# Patient Record
Sex: Male | Born: 1995 | Race: White | Hispanic: No | Marital: Single | Smoking: Never smoker
Health system: Southern US, Community
[De-identification: ages and names within clinical notes are randomized; demographics above are authoritative.]

## PROBLEM LIST (undated history)

## (undated) DIAGNOSIS — I10 Essential (primary) hypertension: Secondary | ICD-10-CM

---
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
## 2013-04-26 ENCOUNTER — Other Ambulatory Visit: Payer: Self-pay | Admitting: Endocrinology

## 2013-04-26 DIAGNOSIS — E049 Nontoxic goiter, unspecified: Secondary | ICD-10-CM

## 2013-04-28 ENCOUNTER — Ambulatory Visit
Admission: RE | Admit: 2013-04-28 | Discharge: 2013-04-28 | Disposition: A | Discharge status: Home / Self Care | Payer: BC Managed Care – PPO | Source: Ambulatory Visit | Attending: Endocrinology | Admitting: Endocrinology

## 2013-04-28 ENCOUNTER — Other Ambulatory Visit: Payer: BC Managed Care – PPO

## 2013-04-28 DIAGNOSIS — E049 Nontoxic goiter, unspecified: Secondary | ICD-10-CM

## 2014-05-31 IMAGING — US US SOFT TISSUE HEAD/NECK
1 series · 14 of 25 positions shown · non-contrast
Comparison: Ultrasound of the thyroid of 06/25/2012

CLINICAL DATA: Follow up of thyroid nodule

THYROID ULTRASOUND
TECHNIQUE: Ultrasound examination of the thyroid gland and adjacent
soft tissues was performed.

[Series 1: us soft tissue head/neck · 0.08mm/px · 14 of 46 slices shown]
[im 1/46]
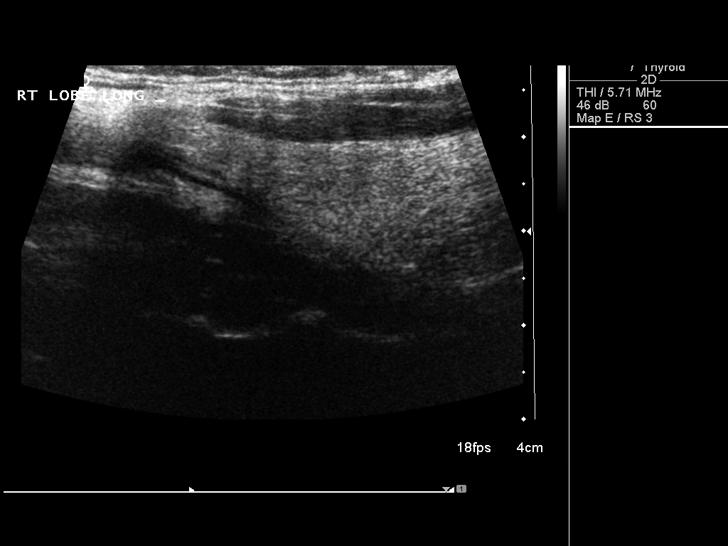
[im 4/46]
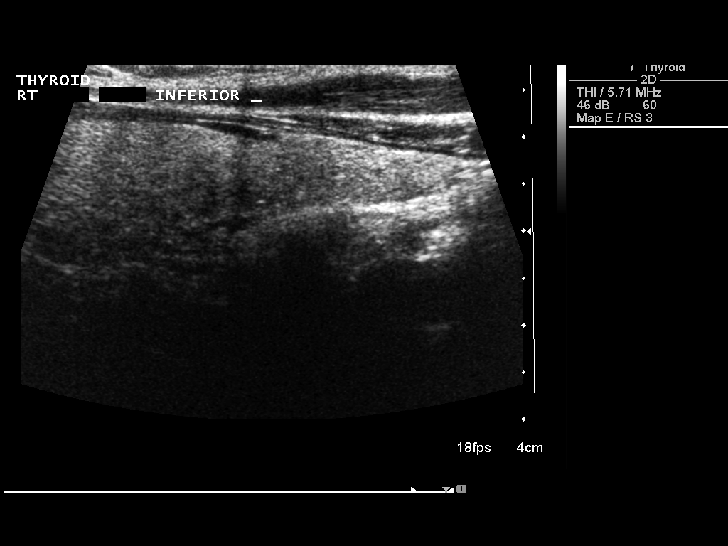
[im 8/46]
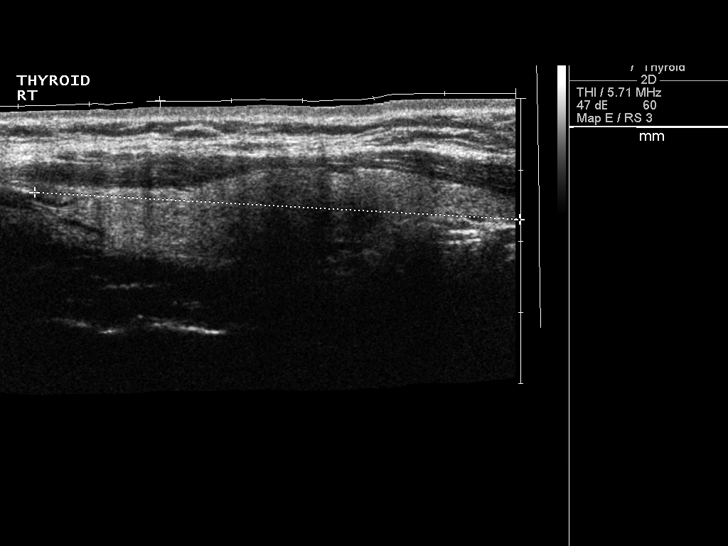
[im 12/46]
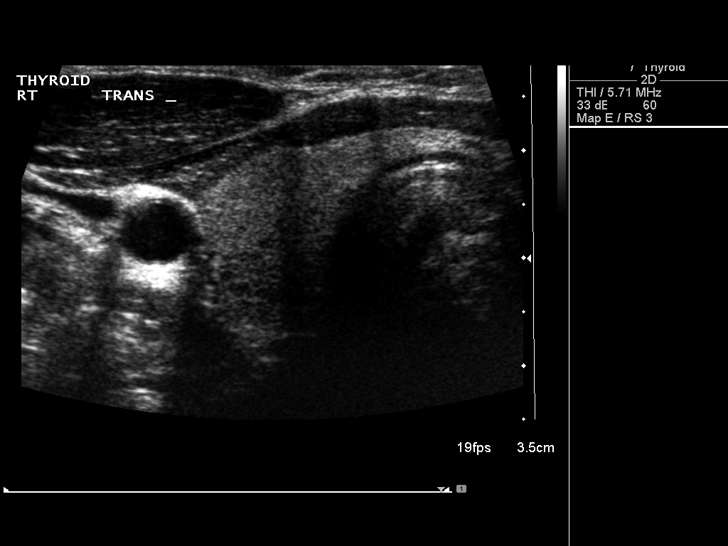
[im 16/46]
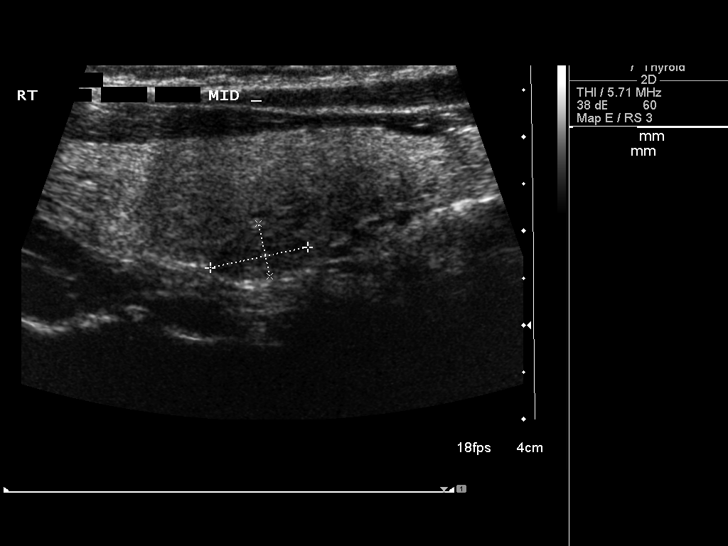
[im 17/46]
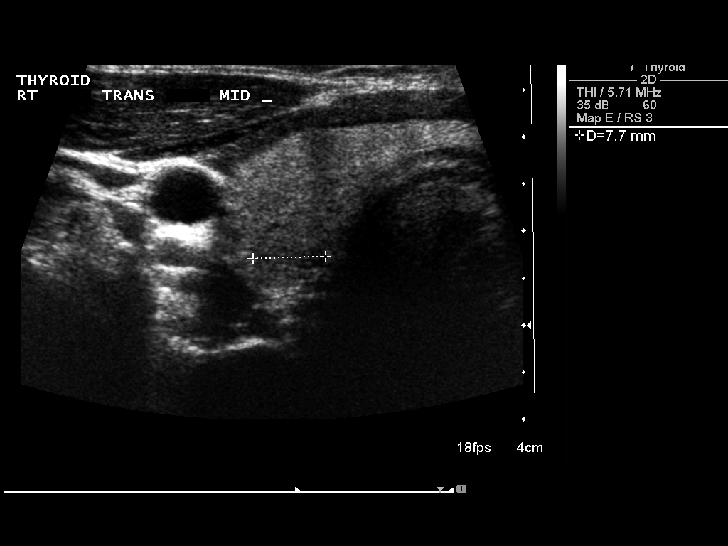
[im 21/46]
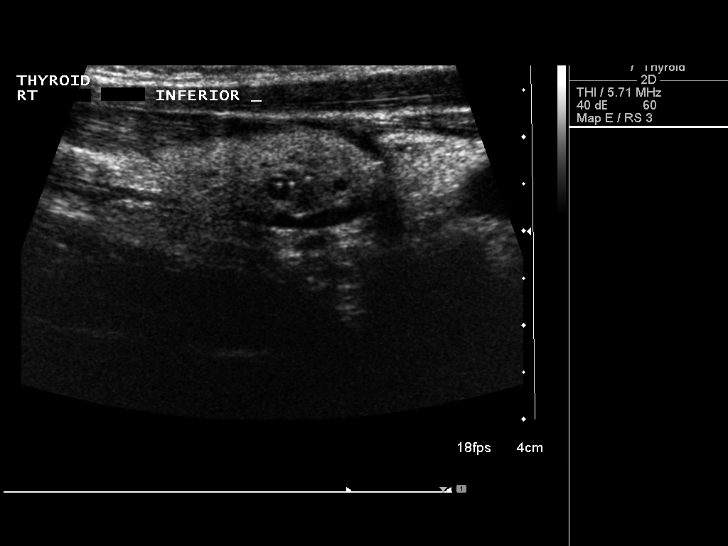
[im 25/46]
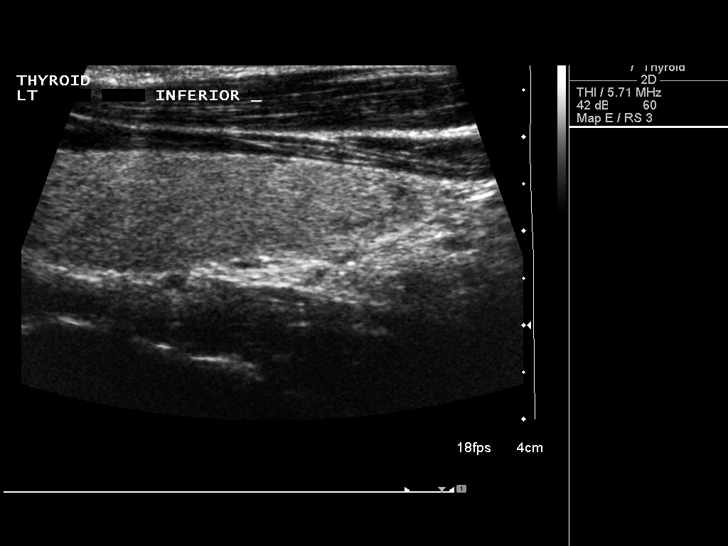
[im 29/46]
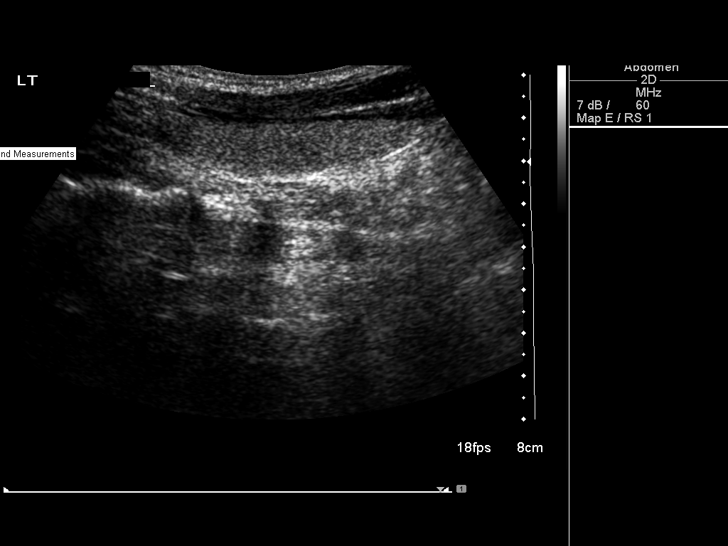
[im 31/46]
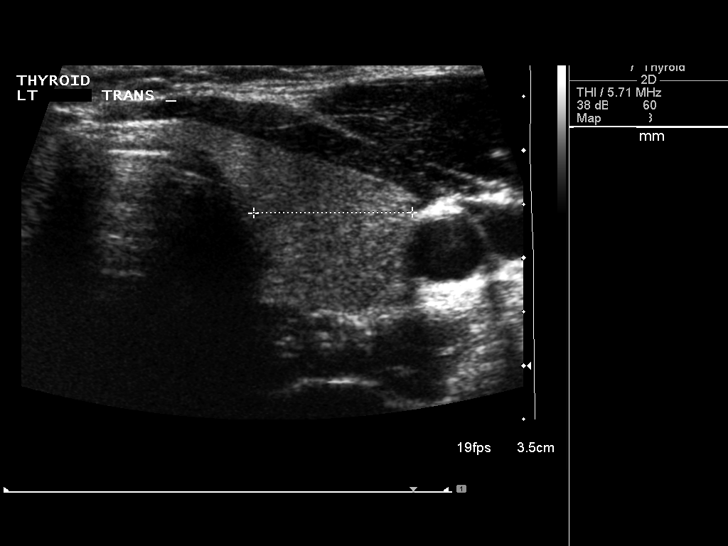
[im 34/46]
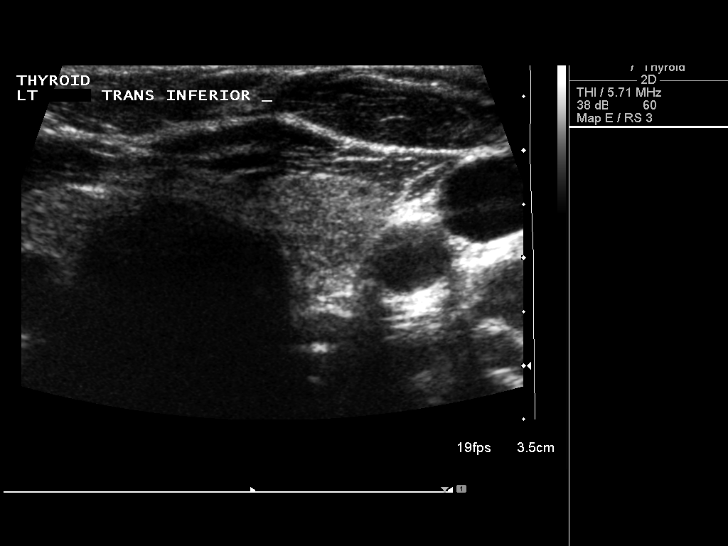
[im 38/46]
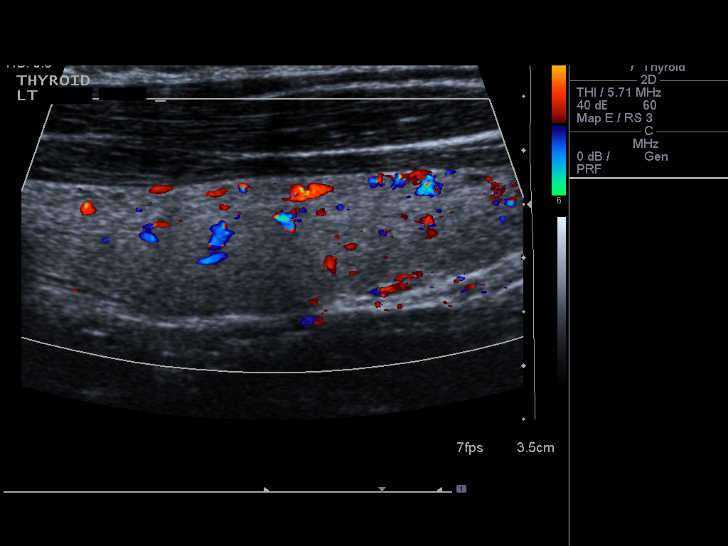
[im 42/46]
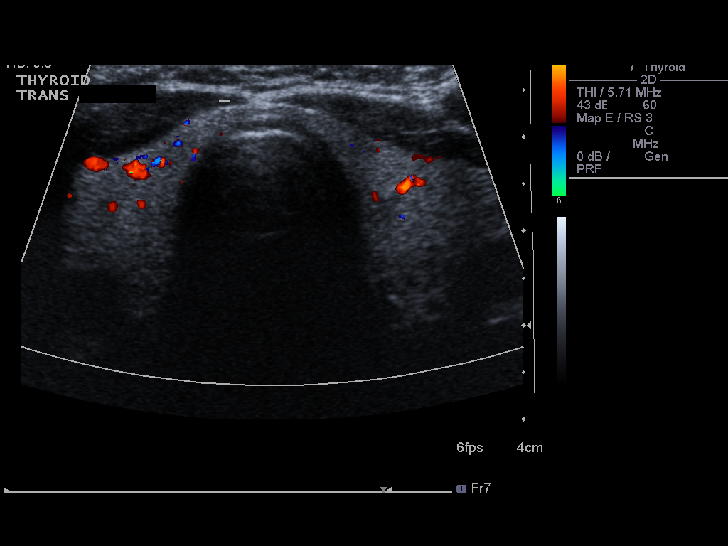
[im 46/46]
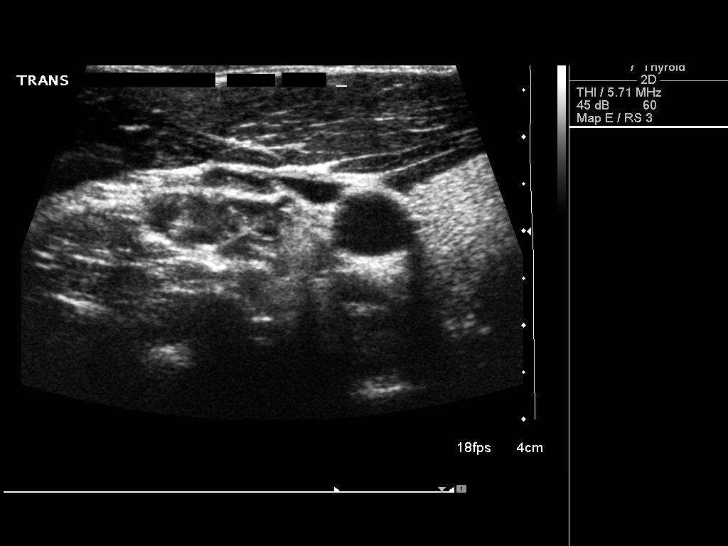

[14 of 25 positions shown; findings below may reference images not displayed]

FINDINGS: Right thyroid lobe:  7.0 x 1.6 x 1.3 cm.  (Previously 7.3 x 1.5 x
1.6 cm).
Left thyroid lobe:  6.5 x 1.3 x 1.5 cm.  (Previously 5.9 x 1.4 x
1.9 cm).
Isthmus:  2 mm in thickness.

Focal nodules:  The echogenicity of the thyroid parenchyma is
homogeneous.  Only a single solid nodule is noted in the right
middle lobe of 1.1 x 0.6 x 0.8 cm, compared to prior measurement of
1.3 x 1.0 x 1.1 cm.  No new or enlarging thyroid nodule is seen.
Smaller hypoechoic nodules are present bilaterally.

Lymphadenopathy:  None visualized.
IMPRESSION: Stable solid nodule in the mid right lobe measuring 1.1 cm in
maximum diameter.  No new or enlarging thyroid nodule is seen.

## 2017-11-30 DIAGNOSIS — J3081 Allergic rhinitis due to animal (cat) (dog) hair and dander: Secondary | ICD-10-CM | POA: Diagnosis not present

## 2017-11-30 DIAGNOSIS — J3089 Other allergic rhinitis: Secondary | ICD-10-CM | POA: Diagnosis not present

## 2017-11-30 DIAGNOSIS — J301 Allergic rhinitis due to pollen: Secondary | ICD-10-CM | POA: Diagnosis not present

## 2017-12-01 DIAGNOSIS — J3089 Other allergic rhinitis: Secondary | ICD-10-CM | POA: Diagnosis not present

## 2017-12-04 DIAGNOSIS — J3081 Allergic rhinitis due to animal (cat) (dog) hair and dander: Secondary | ICD-10-CM | POA: Diagnosis not present

## 2017-12-04 DIAGNOSIS — J301 Allergic rhinitis due to pollen: Secondary | ICD-10-CM | POA: Diagnosis not present

## 2017-12-09 DIAGNOSIS — J3081 Allergic rhinitis due to animal (cat) (dog) hair and dander: Secondary | ICD-10-CM | POA: Diagnosis not present

## 2017-12-09 DIAGNOSIS — J301 Allergic rhinitis due to pollen: Secondary | ICD-10-CM | POA: Diagnosis not present

## 2017-12-09 DIAGNOSIS — J3089 Other allergic rhinitis: Secondary | ICD-10-CM | POA: Diagnosis not present

## 2017-12-14 DIAGNOSIS — J3081 Allergic rhinitis due to animal (cat) (dog) hair and dander: Secondary | ICD-10-CM | POA: Diagnosis not present

## 2017-12-14 DIAGNOSIS — J3089 Other allergic rhinitis: Secondary | ICD-10-CM | POA: Diagnosis not present

## 2017-12-14 DIAGNOSIS — J301 Allergic rhinitis due to pollen: Secondary | ICD-10-CM | POA: Diagnosis not present

## 2017-12-24 DIAGNOSIS — J3089 Other allergic rhinitis: Secondary | ICD-10-CM | POA: Diagnosis not present

## 2017-12-24 DIAGNOSIS — J3081 Allergic rhinitis due to animal (cat) (dog) hair and dander: Secondary | ICD-10-CM | POA: Diagnosis not present

## 2017-12-24 DIAGNOSIS — J301 Allergic rhinitis due to pollen: Secondary | ICD-10-CM | POA: Diagnosis not present

## 2017-12-29 DIAGNOSIS — J3081 Allergic rhinitis due to animal (cat) (dog) hair and dander: Secondary | ICD-10-CM | POA: Diagnosis not present

## 2017-12-29 DIAGNOSIS — J3089 Other allergic rhinitis: Secondary | ICD-10-CM | POA: Diagnosis not present

## 2017-12-29 DIAGNOSIS — J301 Allergic rhinitis due to pollen: Secondary | ICD-10-CM | POA: Diagnosis not present

## 2018-01-06 DIAGNOSIS — J301 Allergic rhinitis due to pollen: Secondary | ICD-10-CM | POA: Diagnosis not present

## 2018-01-06 DIAGNOSIS — J3089 Other allergic rhinitis: Secondary | ICD-10-CM | POA: Diagnosis not present

## 2018-01-06 DIAGNOSIS — J3081 Allergic rhinitis due to animal (cat) (dog) hair and dander: Secondary | ICD-10-CM | POA: Diagnosis not present

## 2018-01-11 DIAGNOSIS — J3089 Other allergic rhinitis: Secondary | ICD-10-CM | POA: Diagnosis not present

## 2018-01-11 DIAGNOSIS — J3081 Allergic rhinitis due to animal (cat) (dog) hair and dander: Secondary | ICD-10-CM | POA: Diagnosis not present

## 2018-01-11 DIAGNOSIS — J301 Allergic rhinitis due to pollen: Secondary | ICD-10-CM | POA: Diagnosis not present

## 2018-01-18 DIAGNOSIS — J3081 Allergic rhinitis due to animal (cat) (dog) hair and dander: Secondary | ICD-10-CM | POA: Diagnosis not present

## 2018-01-18 DIAGNOSIS — J301 Allergic rhinitis due to pollen: Secondary | ICD-10-CM | POA: Diagnosis not present

## 2018-01-26 DIAGNOSIS — J3089 Other allergic rhinitis: Secondary | ICD-10-CM | POA: Diagnosis not present

## 2018-01-26 DIAGNOSIS — J301 Allergic rhinitis due to pollen: Secondary | ICD-10-CM | POA: Diagnosis not present

## 2018-01-26 DIAGNOSIS — J3081 Allergic rhinitis due to animal (cat) (dog) hair and dander: Secondary | ICD-10-CM | POA: Diagnosis not present

## 2018-02-03 DIAGNOSIS — J301 Allergic rhinitis due to pollen: Secondary | ICD-10-CM | POA: Diagnosis not present

## 2018-02-03 DIAGNOSIS — J3089 Other allergic rhinitis: Secondary | ICD-10-CM | POA: Diagnosis not present

## 2018-02-03 DIAGNOSIS — J3081 Allergic rhinitis due to animal (cat) (dog) hair and dander: Secondary | ICD-10-CM | POA: Diagnosis not present

## 2018-02-08 DIAGNOSIS — J301 Allergic rhinitis due to pollen: Secondary | ICD-10-CM | POA: Diagnosis not present

## 2018-02-08 DIAGNOSIS — J3089 Other allergic rhinitis: Secondary | ICD-10-CM | POA: Diagnosis not present

## 2018-02-08 DIAGNOSIS — J3081 Allergic rhinitis due to animal (cat) (dog) hair and dander: Secondary | ICD-10-CM | POA: Diagnosis not present

## 2018-02-17 DIAGNOSIS — J3089 Other allergic rhinitis: Secondary | ICD-10-CM | POA: Diagnosis not present

## 2018-02-17 DIAGNOSIS — J3081 Allergic rhinitis due to animal (cat) (dog) hair and dander: Secondary | ICD-10-CM | POA: Diagnosis not present

## 2018-02-17 DIAGNOSIS — J301 Allergic rhinitis due to pollen: Secondary | ICD-10-CM | POA: Diagnosis not present

## 2018-02-23 DIAGNOSIS — J3089 Other allergic rhinitis: Secondary | ICD-10-CM | POA: Diagnosis not present

## 2018-02-23 DIAGNOSIS — J3081 Allergic rhinitis due to animal (cat) (dog) hair and dander: Secondary | ICD-10-CM | POA: Diagnosis not present

## 2018-02-23 DIAGNOSIS — J301 Allergic rhinitis due to pollen: Secondary | ICD-10-CM | POA: Diagnosis not present

## 2018-02-26 DIAGNOSIS — J3081 Allergic rhinitis due to animal (cat) (dog) hair and dander: Secondary | ICD-10-CM | POA: Diagnosis not present

## 2018-02-26 DIAGNOSIS — J301 Allergic rhinitis due to pollen: Secondary | ICD-10-CM | POA: Diagnosis not present

## 2018-03-03 DIAGNOSIS — J301 Allergic rhinitis due to pollen: Secondary | ICD-10-CM | POA: Diagnosis not present

## 2018-03-03 DIAGNOSIS — J3081 Allergic rhinitis due to animal (cat) (dog) hair and dander: Secondary | ICD-10-CM | POA: Diagnosis not present

## 2018-03-03 DIAGNOSIS — J3089 Other allergic rhinitis: Secondary | ICD-10-CM | POA: Diagnosis not present

## 2018-03-08 DIAGNOSIS — J3089 Other allergic rhinitis: Secondary | ICD-10-CM | POA: Diagnosis not present

## 2018-03-08 DIAGNOSIS — J3081 Allergic rhinitis due to animal (cat) (dog) hair and dander: Secondary | ICD-10-CM | POA: Diagnosis not present

## 2018-03-08 DIAGNOSIS — J301 Allergic rhinitis due to pollen: Secondary | ICD-10-CM | POA: Diagnosis not present

## 2018-03-12 DIAGNOSIS — J301 Allergic rhinitis due to pollen: Secondary | ICD-10-CM | POA: Diagnosis not present

## 2018-03-12 DIAGNOSIS — J3081 Allergic rhinitis due to animal (cat) (dog) hair and dander: Secondary | ICD-10-CM | POA: Diagnosis not present

## 2018-03-18 DIAGNOSIS — J301 Allergic rhinitis due to pollen: Secondary | ICD-10-CM | POA: Diagnosis not present

## 2018-03-18 DIAGNOSIS — J3081 Allergic rhinitis due to animal (cat) (dog) hair and dander: Secondary | ICD-10-CM | POA: Diagnosis not present

## 2018-03-18 DIAGNOSIS — J3089 Other allergic rhinitis: Secondary | ICD-10-CM | POA: Diagnosis not present

## 2018-03-22 DIAGNOSIS — J3081 Allergic rhinitis due to animal (cat) (dog) hair and dander: Secondary | ICD-10-CM | POA: Diagnosis not present

## 2018-03-22 DIAGNOSIS — J3089 Other allergic rhinitis: Secondary | ICD-10-CM | POA: Diagnosis not present

## 2018-03-22 DIAGNOSIS — J301 Allergic rhinitis due to pollen: Secondary | ICD-10-CM | POA: Diagnosis not present

## 2018-03-31 DIAGNOSIS — J301 Allergic rhinitis due to pollen: Secondary | ICD-10-CM | POA: Diagnosis not present

## 2018-03-31 DIAGNOSIS — J3089 Other allergic rhinitis: Secondary | ICD-10-CM | POA: Diagnosis not present

## 2018-03-31 DIAGNOSIS — J3081 Allergic rhinitis due to animal (cat) (dog) hair and dander: Secondary | ICD-10-CM | POA: Diagnosis not present

## 2018-04-14 DIAGNOSIS — J301 Allergic rhinitis due to pollen: Secondary | ICD-10-CM | POA: Diagnosis not present

## 2018-04-14 DIAGNOSIS — J3081 Allergic rhinitis due to animal (cat) (dog) hair and dander: Secondary | ICD-10-CM | POA: Diagnosis not present

## 2018-04-14 DIAGNOSIS — J3089 Other allergic rhinitis: Secondary | ICD-10-CM | POA: Diagnosis not present

## 2018-04-29 DIAGNOSIS — J3089 Other allergic rhinitis: Secondary | ICD-10-CM | POA: Diagnosis not present

## 2018-04-29 DIAGNOSIS — J301 Allergic rhinitis due to pollen: Secondary | ICD-10-CM | POA: Diagnosis not present

## 2018-04-29 DIAGNOSIS — J3081 Allergic rhinitis due to animal (cat) (dog) hair and dander: Secondary | ICD-10-CM | POA: Diagnosis not present

## 2018-05-12 DIAGNOSIS — J3081 Allergic rhinitis due to animal (cat) (dog) hair and dander: Secondary | ICD-10-CM | POA: Diagnosis not present

## 2018-05-12 DIAGNOSIS — J301 Allergic rhinitis due to pollen: Secondary | ICD-10-CM | POA: Diagnosis not present

## 2018-05-12 DIAGNOSIS — J3089 Other allergic rhinitis: Secondary | ICD-10-CM | POA: Diagnosis not present

## 2018-05-19 DIAGNOSIS — J301 Allergic rhinitis due to pollen: Secondary | ICD-10-CM | POA: Diagnosis not present

## 2018-05-19 DIAGNOSIS — J3089 Other allergic rhinitis: Secondary | ICD-10-CM | POA: Diagnosis not present

## 2018-05-19 DIAGNOSIS — J3081 Allergic rhinitis due to animal (cat) (dog) hair and dander: Secondary | ICD-10-CM | POA: Diagnosis not present

## 2018-06-04 DIAGNOSIS — J301 Allergic rhinitis due to pollen: Secondary | ICD-10-CM | POA: Diagnosis not present

## 2018-06-04 DIAGNOSIS — J3081 Allergic rhinitis due to animal (cat) (dog) hair and dander: Secondary | ICD-10-CM | POA: Diagnosis not present

## 2018-06-04 DIAGNOSIS — J3089 Other allergic rhinitis: Secondary | ICD-10-CM | POA: Diagnosis not present

## 2018-06-09 DIAGNOSIS — J3089 Other allergic rhinitis: Secondary | ICD-10-CM | POA: Diagnosis not present

## 2018-06-09 DIAGNOSIS — J3081 Allergic rhinitis due to animal (cat) (dog) hair and dander: Secondary | ICD-10-CM | POA: Diagnosis not present

## 2018-06-09 DIAGNOSIS — J301 Allergic rhinitis due to pollen: Secondary | ICD-10-CM | POA: Diagnosis not present

## 2018-06-11 DIAGNOSIS — J3089 Other allergic rhinitis: Secondary | ICD-10-CM | POA: Diagnosis not present

## 2018-06-30 DIAGNOSIS — J3081 Allergic rhinitis due to animal (cat) (dog) hair and dander: Secondary | ICD-10-CM | POA: Diagnosis not present

## 2018-06-30 DIAGNOSIS — J3089 Other allergic rhinitis: Secondary | ICD-10-CM | POA: Diagnosis not present

## 2018-06-30 DIAGNOSIS — J301 Allergic rhinitis due to pollen: Secondary | ICD-10-CM | POA: Diagnosis not present

## 2018-07-14 DIAGNOSIS — J3081 Allergic rhinitis due to animal (cat) (dog) hair and dander: Secondary | ICD-10-CM | POA: Diagnosis not present

## 2018-07-14 DIAGNOSIS — J3089 Other allergic rhinitis: Secondary | ICD-10-CM | POA: Diagnosis not present

## 2018-07-14 DIAGNOSIS — J301 Allergic rhinitis due to pollen: Secondary | ICD-10-CM | POA: Diagnosis not present

## 2018-07-22 DIAGNOSIS — J301 Allergic rhinitis due to pollen: Secondary | ICD-10-CM | POA: Diagnosis not present

## 2018-07-22 DIAGNOSIS — J3081 Allergic rhinitis due to animal (cat) (dog) hair and dander: Secondary | ICD-10-CM | POA: Diagnosis not present

## 2018-07-22 DIAGNOSIS — J3089 Other allergic rhinitis: Secondary | ICD-10-CM | POA: Diagnosis not present

## 2018-08-09 DIAGNOSIS — J3081 Allergic rhinitis due to animal (cat) (dog) hair and dander: Secondary | ICD-10-CM | POA: Diagnosis not present

## 2018-08-09 DIAGNOSIS — J3089 Other allergic rhinitis: Secondary | ICD-10-CM | POA: Diagnosis not present

## 2018-08-09 DIAGNOSIS — J301 Allergic rhinitis due to pollen: Secondary | ICD-10-CM | POA: Diagnosis not present

## 2018-08-13 DIAGNOSIS — J3089 Other allergic rhinitis: Secondary | ICD-10-CM | POA: Diagnosis not present

## 2018-08-19 DIAGNOSIS — J3081 Allergic rhinitis due to animal (cat) (dog) hair and dander: Secondary | ICD-10-CM | POA: Diagnosis not present

## 2018-08-19 DIAGNOSIS — J301 Allergic rhinitis due to pollen: Secondary | ICD-10-CM | POA: Diagnosis not present

## 2018-08-19 DIAGNOSIS — J3089 Other allergic rhinitis: Secondary | ICD-10-CM | POA: Diagnosis not present

## 2018-09-01 DIAGNOSIS — J3089 Other allergic rhinitis: Secondary | ICD-10-CM | POA: Diagnosis not present

## 2018-09-01 DIAGNOSIS — J3081 Allergic rhinitis due to animal (cat) (dog) hair and dander: Secondary | ICD-10-CM | POA: Diagnosis not present

## 2018-09-01 DIAGNOSIS — J301 Allergic rhinitis due to pollen: Secondary | ICD-10-CM | POA: Diagnosis not present

## 2018-09-10 DIAGNOSIS — J3081 Allergic rhinitis due to animal (cat) (dog) hair and dander: Secondary | ICD-10-CM | POA: Diagnosis not present

## 2018-09-10 DIAGNOSIS — J301 Allergic rhinitis due to pollen: Secondary | ICD-10-CM | POA: Diagnosis not present

## 2018-09-10 DIAGNOSIS — J3089 Other allergic rhinitis: Secondary | ICD-10-CM | POA: Diagnosis not present

## 2018-09-22 DIAGNOSIS — J3089 Other allergic rhinitis: Secondary | ICD-10-CM | POA: Diagnosis not present

## 2018-09-22 DIAGNOSIS — J3081 Allergic rhinitis due to animal (cat) (dog) hair and dander: Secondary | ICD-10-CM | POA: Diagnosis not present

## 2018-09-22 DIAGNOSIS — J301 Allergic rhinitis due to pollen: Secondary | ICD-10-CM | POA: Diagnosis not present

## 2018-10-08 DIAGNOSIS — J3081 Allergic rhinitis due to animal (cat) (dog) hair and dander: Secondary | ICD-10-CM | POA: Diagnosis not present

## 2018-10-08 DIAGNOSIS — J3089 Other allergic rhinitis: Secondary | ICD-10-CM | POA: Diagnosis not present

## 2018-10-08 DIAGNOSIS — J301 Allergic rhinitis due to pollen: Secondary | ICD-10-CM | POA: Diagnosis not present

## 2018-10-12 DIAGNOSIS — J3081 Allergic rhinitis due to animal (cat) (dog) hair and dander: Secondary | ICD-10-CM | POA: Diagnosis not present

## 2018-10-12 DIAGNOSIS — J301 Allergic rhinitis due to pollen: Secondary | ICD-10-CM | POA: Diagnosis not present

## 2018-10-19 DIAGNOSIS — J3089 Other allergic rhinitis: Secondary | ICD-10-CM | POA: Diagnosis not present

## 2018-10-19 DIAGNOSIS — J301 Allergic rhinitis due to pollen: Secondary | ICD-10-CM | POA: Diagnosis not present

## 2018-10-19 DIAGNOSIS — J3081 Allergic rhinitis due to animal (cat) (dog) hair and dander: Secondary | ICD-10-CM | POA: Diagnosis not present

## 2018-11-02 DIAGNOSIS — J3081 Allergic rhinitis due to animal (cat) (dog) hair and dander: Secondary | ICD-10-CM | POA: Diagnosis not present

## 2018-11-02 DIAGNOSIS — J4530 Mild persistent asthma, uncomplicated: Secondary | ICD-10-CM | POA: Diagnosis not present

## 2018-11-02 DIAGNOSIS — J3089 Other allergic rhinitis: Secondary | ICD-10-CM | POA: Diagnosis not present

## 2018-11-02 DIAGNOSIS — J301 Allergic rhinitis due to pollen: Secondary | ICD-10-CM | POA: Diagnosis not present

## 2018-11-16 DIAGNOSIS — J301 Allergic rhinitis due to pollen: Secondary | ICD-10-CM | POA: Diagnosis not present

## 2018-11-16 DIAGNOSIS — J3089 Other allergic rhinitis: Secondary | ICD-10-CM | POA: Diagnosis not present

## 2018-11-16 DIAGNOSIS — J3081 Allergic rhinitis due to animal (cat) (dog) hair and dander: Secondary | ICD-10-CM | POA: Diagnosis not present

## 2018-11-26 DIAGNOSIS — J301 Allergic rhinitis due to pollen: Secondary | ICD-10-CM | POA: Diagnosis not present

## 2018-11-26 DIAGNOSIS — J3081 Allergic rhinitis due to animal (cat) (dog) hair and dander: Secondary | ICD-10-CM | POA: Diagnosis not present

## 2018-11-26 DIAGNOSIS — J3089 Other allergic rhinitis: Secondary | ICD-10-CM | POA: Diagnosis not present

## 2018-12-09 DIAGNOSIS — J301 Allergic rhinitis due to pollen: Secondary | ICD-10-CM | POA: Diagnosis not present

## 2018-12-09 DIAGNOSIS — J3081 Allergic rhinitis due to animal (cat) (dog) hair and dander: Secondary | ICD-10-CM | POA: Diagnosis not present

## 2018-12-09 DIAGNOSIS — J3089 Other allergic rhinitis: Secondary | ICD-10-CM | POA: Diagnosis not present

## 2018-12-13 DIAGNOSIS — J3081 Allergic rhinitis due to animal (cat) (dog) hair and dander: Secondary | ICD-10-CM | POA: Diagnosis not present

## 2018-12-13 DIAGNOSIS — J301 Allergic rhinitis due to pollen: Secondary | ICD-10-CM | POA: Diagnosis not present

## 2018-12-13 DIAGNOSIS — J3089 Other allergic rhinitis: Secondary | ICD-10-CM | POA: Diagnosis not present

## 2018-12-20 DIAGNOSIS — J301 Allergic rhinitis due to pollen: Secondary | ICD-10-CM | POA: Diagnosis not present

## 2018-12-20 DIAGNOSIS — J3089 Other allergic rhinitis: Secondary | ICD-10-CM | POA: Diagnosis not present

## 2018-12-20 DIAGNOSIS — J3081 Allergic rhinitis due to animal (cat) (dog) hair and dander: Secondary | ICD-10-CM | POA: Diagnosis not present

## 2018-12-27 DIAGNOSIS — J3089 Other allergic rhinitis: Secondary | ICD-10-CM | POA: Diagnosis not present

## 2018-12-27 DIAGNOSIS — J301 Allergic rhinitis due to pollen: Secondary | ICD-10-CM | POA: Diagnosis not present

## 2018-12-27 DIAGNOSIS — J3081 Allergic rhinitis due to animal (cat) (dog) hair and dander: Secondary | ICD-10-CM | POA: Diagnosis not present

## 2018-12-31 DIAGNOSIS — J3081 Allergic rhinitis due to animal (cat) (dog) hair and dander: Secondary | ICD-10-CM | POA: Diagnosis not present

## 2018-12-31 DIAGNOSIS — J301 Allergic rhinitis due to pollen: Secondary | ICD-10-CM | POA: Diagnosis not present

## 2018-12-31 DIAGNOSIS — J3089 Other allergic rhinitis: Secondary | ICD-10-CM | POA: Diagnosis not present

## 2019-01-07 DIAGNOSIS — Z8342 Family history of familial hypercholesterolemia: Secondary | ICD-10-CM | POA: Diagnosis not present

## 2019-01-07 DIAGNOSIS — R110 Nausea: Secondary | ICD-10-CM | POA: Diagnosis not present

## 2019-01-07 DIAGNOSIS — J302 Other seasonal allergic rhinitis: Secondary | ICD-10-CM | POA: Diagnosis not present

## 2019-01-07 DIAGNOSIS — Z833 Family history of diabetes mellitus: Secondary | ICD-10-CM | POA: Diagnosis not present

## 2019-01-07 DIAGNOSIS — Z1159 Encounter for screening for other viral diseases: Secondary | ICD-10-CM | POA: Diagnosis not present

## 2019-01-07 DIAGNOSIS — J45909 Unspecified asthma, uncomplicated: Secondary | ICD-10-CM | POA: Diagnosis not present

## 2019-01-17 DIAGNOSIS — J3089 Other allergic rhinitis: Secondary | ICD-10-CM | POA: Diagnosis not present

## 2019-01-17 DIAGNOSIS — J3081 Allergic rhinitis due to animal (cat) (dog) hair and dander: Secondary | ICD-10-CM | POA: Diagnosis not present

## 2019-01-17 DIAGNOSIS — J301 Allergic rhinitis due to pollen: Secondary | ICD-10-CM | POA: Diagnosis not present

## 2019-02-16 DIAGNOSIS — J3081 Allergic rhinitis due to animal (cat) (dog) hair and dander: Secondary | ICD-10-CM | POA: Diagnosis not present

## 2019-02-16 DIAGNOSIS — J3089 Other allergic rhinitis: Secondary | ICD-10-CM | POA: Diagnosis not present

## 2019-02-16 DIAGNOSIS — J301 Allergic rhinitis due to pollen: Secondary | ICD-10-CM | POA: Diagnosis not present

## 2019-03-17 DIAGNOSIS — J301 Allergic rhinitis due to pollen: Secondary | ICD-10-CM | POA: Diagnosis not present

## 2019-03-17 DIAGNOSIS — J3089 Other allergic rhinitis: Secondary | ICD-10-CM | POA: Diagnosis not present

## 2019-03-17 DIAGNOSIS — J3081 Allergic rhinitis due to animal (cat) (dog) hair and dander: Secondary | ICD-10-CM | POA: Diagnosis not present

## 2022-10-31 ENCOUNTER — Ambulatory Visit
Admission: RE | Admit: 2022-10-31 | Discharge: 2022-10-31 | Disposition: A | Discharge status: Home / Self Care | Payer: 59 | Source: Ambulatory Visit | Attending: Allergy | Admitting: Allergy

## 2022-10-31 ENCOUNTER — Other Ambulatory Visit: Payer: Self-pay | Admitting: Allergy

## 2022-10-31 DIAGNOSIS — J4530 Mild persistent asthma, uncomplicated: Secondary | ICD-10-CM

## 2023-02-28 ENCOUNTER — Encounter (HOSPITAL_BASED_OUTPATIENT_CLINIC_OR_DEPARTMENT_OTHER): Payer: Self-pay | Admitting: Emergency Medicine

## 2023-02-28 ENCOUNTER — Emergency Department (HOSPITAL_BASED_OUTPATIENT_CLINIC_OR_DEPARTMENT_OTHER)
Admission: EM | Admit: 2023-02-28 | Discharge: 2023-02-28 | Disposition: A | Discharge status: Home / Self Care | Payer: 59 | Attending: Emergency Medicine | Admitting: Emergency Medicine

## 2023-02-28 ENCOUNTER — Other Ambulatory Visit: Payer: Self-pay

## 2023-02-28 DIAGNOSIS — Z0283 Encounter for blood-alcohol and blood-drug test: Secondary | ICD-10-CM | POA: Diagnosis present

## 2023-02-28 HISTORY — DX: Essential (primary) hypertension: I10

## 2023-02-28 NOTE — ED Provider Notes (Signed)
  [facility] Provider Note   CSN: [id number] Arrival date & time: 02/28/23  2105     History  Chief Complaint  Patient presents with   Drug Screen    Robert Mahoney is a 27 y.o. male.  Patient here for drug testing and for his employer.  No symptoms.  The history is provided by the patient.       Home Medications Prior to Admission medications   Not on File      Allergies    Patient has no known allergies.    Review of Systems   Review of Systems  Physical Exam Updated Vital Signs BP (!) 144/78   Pulse 84   Temp 98.4 F (36.9 C) (Oral)   Resp 16   Ht 6\' 1"  (1.854 m)   Wt 88.5 kg   SpO2 99%   BMI 25.73 kg/m  Physical Exam Neurological:     Mental Status: He is alert.     ED Results / Procedures / Treatments   Labs (all labs ordered are listed, but only abnormal results are displayed) Labs Reviewed - No data to display  EKG None  Radiology No results found.  Procedures Procedures    Medications Ordered in ED Medications - No data to display  ED Course/ Medical Decision Making/ A&P                             Medical Decision Making  [name] patient is here for drug screen and for his employer.  Discharged after samples collected per protocol.  Normal vitals.  Well-appearing.  This chart was dictated using voice recognition software.  Despite best efforts to proofread,  errors can occur which can change the documentation meaning.         Final Clinical Impression(s) / ED Diagnoses Final diagnoses:  Encounter for drug screening    Rx / DC Orders ED Discharge Orders     None         Virgina Norfolk, DO 02/28/23 2118

## 2023-02-28 NOTE — ED Triage Notes (Signed)
Pt here for drug screen for his employer

## 2023-05-28 ENCOUNTER — Emergency Department (HOSPITAL_COMMUNITY): Payer: 59

## 2023-05-28 ENCOUNTER — Encounter (HOSPITAL_COMMUNITY): Payer: Self-pay

## 2023-05-28 ENCOUNTER — Observation Stay (HOSPITAL_COMMUNITY): Payer: 59

## 2023-05-28 ENCOUNTER — Observation Stay (HOSPITAL_COMMUNITY)
Admission: EM | Admit: 2023-05-28 | Discharge: 2023-05-29 | Disposition: A | Discharge status: Home / Self Care | Payer: 59 | Attending: Internal Medicine | Admitting: Internal Medicine

## 2023-05-28 ENCOUNTER — Other Ambulatory Visit: Payer: Self-pay

## 2023-05-28 DIAGNOSIS — G929 Unspecified toxic encephalopathy: Secondary | ICD-10-CM | POA: Diagnosis present

## 2023-05-28 DIAGNOSIS — U071 COVID-19: Secondary | ICD-10-CM | POA: Diagnosis present

## 2023-05-28 DIAGNOSIS — Z79899 Other long term (current) drug therapy: Secondary | ICD-10-CM | POA: Diagnosis not present

## 2023-05-28 DIAGNOSIS — R42 Dizziness and giddiness: Secondary | ICD-10-CM | POA: Diagnosis present

## 2023-05-28 DIAGNOSIS — R2681 Unsteadiness on feet: Secondary | ICD-10-CM

## 2023-05-28 DIAGNOSIS — I10 Essential (primary) hypertension: Secondary | ICD-10-CM | POA: Diagnosis not present

## 2023-05-28 DIAGNOSIS — G43109 Migraine with aura, not intractable, without status migrainosus: Secondary | ICD-10-CM

## 2023-05-28 LAB — COMPREHENSIVE METABOLIC PANEL
ALT: 39 U/L (ref 0–44)
AST: 24 U/L (ref 15–41)
Albumin: 4.4 g/dL (ref 3.5–5.0)
Alkaline Phosphatase: 65 U/L (ref 38–126)
Anion gap: 10 (ref 5–15)
BUN: 11 mg/dL (ref 6–20)
CO2: 25 mmol/L (ref 22–32)
Calcium: 9.2 mg/dL (ref 8.9–10.3)
Chloride: 101 mmol/L (ref 98–111)
Creatinine, Ser: 1.09 mg/dL (ref 0.61–1.24)
GFR, Estimated: >60 mL/min (ref >60)
Glucose, Bld: 116 mg/dL — ABNORMAL HIGH (ref 70–99)
Potassium: 3.4 mmol/L — ABNORMAL LOW (ref 3.5–5.1)
Sodium: 136 mmol/L (ref 135–145)
Total Bilirubin: 0.7 mg/dL (ref 0.3–1.2)
Total Protein: 7.8 g/dL (ref 6.5–8.1)

## 2023-05-28 LAB — CBC
HCT: 45 % (ref 39.0–52.0)
Hemoglobin: 15.4 g/dL (ref 13.0–17.0)
MCH: 27.7 pg (ref 26.0–34.0)
MCHC: 34.2 g/dL (ref 30.0–36.0)
MCV: 80.9 fL (ref 80.0–100.0)
Platelets: 258 10*3/uL (ref 150–400)
RBC: 5.56 MIL/uL (ref 4.22–5.81)
RDW: 12 % (ref 11.5–15.5)
WBC: 11.6 10*3/uL — ABNORMAL HIGH (ref 4.0–10.5)
nRBC: 0 % (ref 0.0–0.2)

## 2023-05-28 LAB — I-STAT CHEM 8, ED
BUN: 13 mg/dL (ref 6–20)
Calcium, Ion: 1.12 mmol/L — ABNORMAL LOW (ref 1.15–1.40)
Chloride: 102 mmol/L (ref 98–111)
Creatinine, Ser: 1.1 mg/dL (ref 0.61–1.24)
Glucose, Bld: 114 mg/dL — ABNORMAL HIGH (ref 70–99)
HCT: 46 % (ref 39.0–52.0)
Hemoglobin: 15.6 g/dL (ref 13.0–17.0)
Potassium: 3.5 mmol/L (ref 3.5–5.1)
Sodium: 140 mmol/L (ref 135–145)
TCO2: 26 mmol/L (ref 22–32)

## 2023-05-28 LAB — C-REACTIVE PROTEIN: CRP: 0.9 mg/dL (ref <1.0)

## 2023-05-28 LAB — RAPID URINE DRUG SCREEN, HOSP PERFORMED
Amphetamines: NOT DETECTED
Barbiturates: NOT DETECTED
Benzodiazepines: NOT DETECTED
Cocaine: NOT DETECTED
Opiates: NOT DETECTED
Tetrahydrocannabinol: NOT DETECTED

## 2023-05-28 LAB — PROTIME-INR
INR: 1 (ref 0.8–1.2)
Prothrombin Time: 13.4 seconds (ref 11.4–15.2)

## 2023-05-28 LAB — DIFFERENTIAL
Abs Immature Granulocytes: 0.04 10*3/uL (ref 0.00–0.07)
Basophils Absolute: 0.1 10*3/uL (ref 0.0–0.1)
Basophils Relative: 1 %
Eosinophils Absolute: 0.4 10*3/uL (ref 0.0–0.5)
Eosinophils Relative: 4 %
Immature Granulocytes: 0 %
Lymphocytes Relative: 25 %
Lymphs Abs: 2.9 10*3/uL (ref 0.7–4.0)
Monocytes Absolute: 1.3 10*3/uL — ABNORMAL HIGH (ref 0.1–1.0)
Monocytes Relative: 11 %
Neutro Abs: 6.9 10*3/uL (ref 1.7–7.7)
Neutrophils Relative %: 59 %

## 2023-05-28 LAB — APTT: aPTT: 27 seconds (ref 24–36)

## 2023-05-28 LAB — HIV ANTIBODY (ROUTINE TESTING W REFLEX): HIV Screen 4th Generation wRfx: NONREACTIVE

## 2023-05-28 LAB — VITAMIN B12: Vitamin B-12: 357 pg/mL (ref 180–914)

## 2023-05-28 LAB — ETHANOL: Alcohol, Ethyl (B): <10 mg/dL (ref <10)

## 2023-05-28 LAB — AMMONIA: Ammonia: 39 umol/L — ABNORMAL HIGH (ref 9–35)

## 2023-05-28 MED ORDER — SODIUM CHLORIDE 0.9% FLUSH: 3.0000 mL | Freq: Once | INTRAVENOUS | Status: DC

## 2023-05-28 MED ORDER — ONDANSETRON HCL 4 MG/2ML IJ SOLN: 4.0000 mg | Freq: Four times a day (QID) | INTRAMUSCULAR | Status: DC | PRN

## 2023-05-28 MED ORDER — SENNOSIDES-DOCUSATE SODIUM 8.6-50 MG PO TABS: 1.0000 | ORAL_TABLET | Freq: Every evening | ORAL | Status: DC | PRN

## 2023-05-28 MED ORDER — ACETAMINOPHEN 325 MG PO TABS: 650.0000 mg | ORAL_TABLET | Freq: Four times a day (QID) | ORAL | Status: DC | PRN

## 2023-05-28 MED ORDER — ACETAMINOPHEN 650 MG RE SUPP: 650.0000 mg | Freq: Four times a day (QID) | RECTAL | Status: DC | PRN

## 2023-05-28 MED ORDER — ONDANSETRON HCL 4 MG PO TABS: 4.0000 mg | ORAL_TABLET | Freq: Four times a day (QID) | ORAL | Status: DC | PRN

## 2023-05-28 MED ORDER — FLUTICASONE PROPIONATE 50 MCG/ACT NA SUSP
1.0000 | Freq: Every day | NASAL | Status: DC
  Administered 2023-05-29: 1 via NASAL
  Filled 2023-05-28: qty 16

## 2023-05-28 MED ORDER — GUAIFENESIN ER 600 MG PO TB12
1200.0000 mg | ORAL_TABLET | Freq: Two times a day (BID) | ORAL | Status: DC
  Administered 2023-05-28 – 2023-05-29 (×2): 1200 mg via ORAL
  Filled 2023-05-28 (×2): qty 2

## 2023-05-28 MED ORDER — LACTATED RINGERS IV SOLN: INTRAVENOUS | Status: DC

## 2023-05-28 MED ORDER — BENZONATATE 100 MG PO CAPS
100.0000 mg | ORAL_CAPSULE | Freq: Three times a day (TID) | ORAL | Status: DC
  Administered 2023-05-28 – 2023-05-29 (×2): 100 mg via ORAL
  Filled 2023-05-28 (×3): qty 1

## 2023-05-28 MED ORDER — AMLODIPINE BESYLATE 5 MG PO TABS
5.0000 mg | ORAL_TABLET | Freq: Every day | ORAL | Status: DC
  Administered 2023-05-29: 5 mg via ORAL
  Filled 2023-05-28: qty 1

## 2023-05-28 MED ORDER — IOHEXOL 350 MG/ML SOLN
75.0000 mL | Freq: Once | INTRAVENOUS | Status: AC | PRN
  Administered 2023-05-28: 75 mL via INTRAVENOUS

## 2023-05-28 MED ORDER — DEXAMETHASONE 4 MG PO TABS
6.0000 mg | ORAL_TABLET | Freq: Every day | ORAL | Status: DC
  Administered 2023-05-28 – 2023-05-29 (×2): 6 mg via ORAL
  Filled 2023-05-28 (×2): qty 2

## 2023-05-28 MED ORDER — MOLNUPIRAVIR 200 MG PO CAPS
4.0000 | ORAL_CAPSULE | Freq: Two times a day (BID) | ORAL | Status: DC
  Administered 2023-05-28 – 2023-05-29 (×2): 800 mg via ORAL
  Filled 2023-05-28 (×2): qty 4

## 2023-05-28 MED ORDER — POTASSIUM CHLORIDE CRYS ER 20 MEQ PO TBCR: 40.0000 meq | EXTENDED_RELEASE_TABLET | Freq: Once | ORAL | Status: DC

## 2023-05-28 MED ORDER — ACETAMINOPHEN 500 MG PO TABS: 1000.0000 mg | ORAL_TABLET | Freq: Once | ORAL | Status: DC

## 2023-05-28 MED ORDER — DIPHENHYDRAMINE HCL 50 MG/ML IJ SOLN
25.0000 mg | Freq: Once | INTRAMUSCULAR | Status: AC
  Administered 2023-05-28: 25 mg via INTRAVENOUS
  Filled 2023-05-28: qty 1

## 2023-05-28 MED ORDER — LORAZEPAM 2 MG/ML IJ SOLN
1.0000 mg | INTRAMUSCULAR | Status: DC | PRN
  Administered 2023-05-28: 1 mg via INTRAVENOUS
  Filled 2023-05-28: qty 1

## 2023-05-28 MED ORDER — METOCLOPRAMIDE HCL 5 MG/ML IJ SOLN
10.0000 mg | Freq: Once | INTRAMUSCULAR | Status: AC
  Administered 2023-05-28: 10 mg via INTRAVENOUS
  Filled 2023-05-28: qty 2

## 2023-05-28 MED ORDER — ENOXAPARIN SODIUM 40 MG/0.4ML IJ SOSY
40.0000 mg | PREFILLED_SYRINGE | INTRAMUSCULAR | Status: DC
  Filled 2023-05-28: qty 0.4

## 2023-05-28 NOTE — ED Notes (Signed)
Pt refused blood draws

## 2023-05-28 NOTE — Code Documentation (Signed)
Stroke Response Nurse Documentation Code Documentation  Robert Mahoney is a 27 y.o. male arriving to [facility]  via [employer] on 05/28/2023 with past medical hx of hypertension. States he thinks he has had migraines in the past but not officially diagnosed. On No antithrombotic. Covid positive last week and has been at home recovering.   Patient from home where he was LKW at 0945 and noted his vision was not normal but cannot describe specifically how. Also noted confusion and unsteady gait. Complained of a headache during this time. He checked his BP when the symptoms occurred and it was normal. Code stroke was activated by ED.   Stroke team at the bedside on patient arrival. Labs drawn and patient cleared for CT by Dr. Charm Barges. Patient to CT with team. NIHSS 1, see documentation for details and code stroke times. Patient with right decreased sensation on exam. The following imaging was completed:  CT Head and CTA. Patient is not a candidate for IV Thrombolytic due to stroke not suspected. Patient is not a candidate for IR due to VAN negative and no LVO. Orthostatic BPs done in ED, see VS documentation.   Care Plan: swallow screen, MRI, Q30 assessments until out of window for TNK at 1415.   Bedside handoff with ED RN.    Ferman Hamming Stroke Response RN

## 2023-05-28 NOTE — ED Notes (Incomplete)
Patient transported to MRI by transport, alert oriented and in no pain.

## 2023-05-28 NOTE — ED Notes (Signed)
Robert Mahoney  [phone]

## 2023-05-28 NOTE — ED Notes (Signed)
Pt refused all Labs & meds

## 2023-05-28 NOTE — ED Triage Notes (Signed)
Pt LNK 0940 today, felt dizzy, hypertensive at home, ataxia, confusion, aphasia, feeling of fullness in head. Numbness on L cheek and arm.

## 2023-05-28 NOTE — ED Notes (Signed)
Patient transported to MRI 

## 2023-05-28 NOTE — ED Provider Notes (Signed)
Received patient in turnover from Dr. Charm Barges.  Please see their note for further details of Hx, PE.  Briefly patient is a 27 y.o. male with a Dizziness and Code Stroke .  Thought initially to be a complex migraine.  Plan for MRI imaging and repeat assessment.  Patient is still very unsteady on ambulation.  I discussed case with the neurologist who recommended medical admission PT OT.  Discussed with medicine for admission.       Melene Plan, DO 05/28/23 2014

## 2023-05-28 NOTE — ED Notes (Signed)
Pt agreed to lab draw, samples sent off to lab

## 2023-05-28 NOTE — Consult Note (Signed)
Stroke Neurology Consultation Note  Consult Requested by: Dr. Charm Barges  Reason for Consult: code stroke  Consult Date: 05/28/23   The history was obtained from the pt.  During history and examination, all items were able to obtain unless otherwise noted.  History of Present Illness:  Robert Mahoney is a 27 y.o. Caucasian male with PMH of HTN, migraine aura presented to ED for code stroke. 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 Getting better over the last week and considering to go back to work soon. This morning, he was doing well, had breakfast, and took his BP meds.  Around 9:45 AM, he was sitting in sofa playing video games, he had a sudden feeling of confusion with weird vision disturbance.  He cannot quite explaining the beard visual disturbance, and also felt weird sensation of both legs, when he got up, he felt unsteady and stumbles on walking.  He felt some pressure at the back of head, some lightheadedness.  He called his girlfriend around 10:15 AM and drove himself to ED.  The ED, he felt some nauseous but no vomiting.  CT no acute abnormality.  He stated that he has history of hypertension, taking amlodipine and losartan.  He to medication this morning, he did not check blood pressure before taking medication, however after symptoms onset, he took BP and it was 160s. On arrival his BP 170/100.  He was notable noted tachycardia in the ED and CT scanner.  He stated that he had 2-3 times migraine aura in the past, infrequent, 9 months to a year apart.  Not on any medication before.  LSN: 9:45 AM tPA Given: No: Non disabling symptoms, likely not stroke  Past Medical History:  Diagnosis Date   Hypertension     History reviewed. No pertinent surgical history.  History reviewed. No pertinent family history.  Social History:  reports that he has never smoked. He has never used smokeless tobacco. He reports that he does not currently use alcohol. He reports  that he does not currently use drugs.  Allergies: No Known Allergies  No current facility-administered medications on file prior to encounter.   No current outpatient medications on file prior to encounter.    Review of Systems: A full ROS was attempted today and was able to be performed.  Systems assessed include - Constitutional, Eyes, HENT, Respiratory, Cardiovascular, Gastrointestinal, Genitourinary, Integument/breast, Hematologic/lymphatic, Musculoskeletal, Neurological, Behavioral/Psych, Endocrine, Allergic/Immunologic - with pertinent responses as per HPI.  Physical Examination: Temp:  [98.6 F (37 C)] 98.6 F (37 C) (07/11 1112) Pulse Rate:  [113-131] 113 (07/11 1112) Resp:  [18] 18 (07/11 1112) BP: (137-170)/(70-100) 155/91 (07/11 1112) SpO2:  [98 %-99 %] 98 % (07/11 1112) Weight:  [89.6 kg] 89.6 kg (07/11 1109)  General - well nourished, well developed, in no apparent distress.    Ophthalmologic - fundi not visualized due to noncooperation.    Cardiovascular - regular rhythm and rate  Mental Status -  Level of arousal and orientation to time, place, and person were intact.  Mildly anxious Language including expression, naming, repetition, comprehension was assessed and found intact. Fund of Knowledge was assessed and was intact.  Cranial Nerves II - XII - II - Vision intact OU. III, IV, VI - Extraocular movements intact.  No disconjugate eyes V - Facial sensation mildly decreased on the left face. VII - Facial movement intact bilaterally. VIII - Hearing & vestibular intact bilaterally.  No nystagmus X - Palate  elevates symmetrically. XI - Chin turning & shoulder shrug intact bilaterally. XII - Tongue protrusion intact.  Motor Strength - The patient's strength was normal in all extremities and pronator drift was absent.   Motor Tone & Bulk - Muscle tone was assessed at the neck and appendages and was normal.  Bulk was normal and fasciculations were absent.    Reflexes - The patient's reflexes were normal in all extremities and he had no pathological reflexes.  Sensory - Light touch, temperature/pinprick were assessed and were mildly decreased on the left upper extremity.    Coordination - The patient had normal movements in the hands and feet with no ataxia or dysmetria.  Tremor was absent.  Gait and Station - deferred  NIH score 1 for sensory  Data Reviewed: CT ANGIO HEAD NECK W WO CM (CODE STROKE)  Result Date: 05/28/2023 CLINICAL DATA:  Limb ataxia, dizziness EXAM: CT ANGIOGRAPHY HEAD AND NECK WITH AND WITHOUT CONTRAST TECHNIQUE: Multidetector CT imaging of the head and neck was performed using the standard protocol during bolus administration of intravenous contrast. Multiplanar CT image reconstructions and MIPs were obtained to evaluate the vascular anatomy. Carotid stenosis measurements (when applicable) are obtained utilizing NASCET criteria, using the distal internal carotid diameter as the denominator. RADIATION DOSE REDUCTION: This exam was performed according to the departmental dose-optimization program which includes automated exposure control, adjustment of the mA and/or kV according to patient size and/or use of iterative reconstruction technique. CONTRAST:  75mL OMNIPAQUE IOHEXOL 350 MG/ML SOLN COMPARISON:  Same-day noncontrast CT head FINDINGS: CTA NECK FINDINGS Aortic arch: Imaged aortic arch is normal. The origins of the major branch vessels are patent. The subclavian arteries are patent to the level imaged. Right carotid system: The right common, internal, and external carotid arteries are patent, without hemodynamically significant stenosis or occlusion. There is no evidence of dissection or aneurysm. Left carotid system: The left common, internal, and external carotid arteries are patent, without hemodynamically significant stenosis or occlusion there is no evidence of dissection or aneurysm. Vertebral arteries: The vertebral  arteries are patent, without hemodynamically significant stenosis or occlusion there is no evidence of dissection or aneurysm. Skeleton: There is no acute osseous abnormality or suspicious osseous lesion. Other neck: Soft tissues of the neck are unremarkable. Upper chest: The imaged lung apices are clear. Review of the MIP images confirms the above findings CTA HEAD FINDINGS Anterior circulation: The intracranial ICAs are normal. The bilateral MCAs and ACAS are normal. The anterior communicating artery is normal There is no aneurysm or AVM. Posterior circulation: The bilateral V4 segments are normal. The basilar artery is normal. The major cerebellar arteries are normal. The bilateral PCAs are normal. Diminutive posterior communicating arteries are identified. There is no aneurysm or AVM. Venous sinuses: Patent. Anatomic variants: None. Review of the MIP images confirms the above findings IMPRESSION: Normal CTA of the head and neck. Electronically Signed   By: Lesia Hausen M.D.   On: 05/28/2023 11:17   CT HEAD CODE STROKE WO CONTRAST  Result Date: 05/28/2023 CLINICAL DATA:  Code stroke.  Neuro deficit, acute, stroke suspected EXAM: CT HEAD WITHOUT CONTRAST TECHNIQUE: Contiguous axial images were obtained from the base of the skull through the vertex without intravenous contrast. RADIATION DOSE REDUCTION: This exam was performed according to the departmental dose-optimization program which includes automated exposure control, adjustment of the mA and/or kV according to patient size and/or use of iterative reconstruction technique. COMPARISON:  None Available. FINDINGS: Brain: No evidence of acute infarction,  hemorrhage, hydrocephalus, extra-axial collection or mass lesion/mass effect. Vascular: No hyperdense vessel or unexpected calcification. Skull: Normal. Negative for fracture or focal lesion. Sinuses/Orbits: No middle ear or mastoid effusion. Paranasal sinuses are clear orbits are unremarkable Other: None.  ASPECTS [facility] Stroke Program Early CT Score): 10 IMPRESSION: No hemorrhage or CT evidence of an acute cortical infarct. Findings were paged to Dr. Roda Shutters on 05/28/23 at 10:46 AM via [facility] paging system. Electronically Signed   By: Lorenza Cambridge M.D.   On: 05/28/2023 10:46    Assessment: 27 y.o. male with PMH of HTN, migraine aura presented to ED for acute onset feeling of confusion with weird vision disturbance, weird sensation of both legs, unsteady and stumbles on walking.  Time onset 9:45 AM.  ER noted hypertension and tachycardia.  NIH score 1 for sensory. CT no acute abnormality.  CTA head and neck negative.  Patient not a TNK candidate given nondisabling symptoms, bilateral candidate given no LVO.  Etiology of her symptoms concerning for migraine aura/equivalent, anxiety/panic attack, or BP medication effect. Will do MRI to rule out stroke.  Anticipate improvement of symptoms while in ED.  Plan: Frequent neuro checks Telemetry monitoring MRI brain  Anxiety management per EDP  If MRI negative, patient feels better and able to ambulate ED, patient can be discharged from neurostandpoint with close neuro follow-up as outpatient. Discussed with Dr. Charm Barges ED physician Will follow on MRI result   Thank you for this consultation and allowing [place] to participate in the care of this patient.  Marvel Plan, MD PhD Stroke Neurology 05/28/2023 11:28 AM

## 2023-05-28 NOTE — Discharge Instructions (Addendum)
Follow with Primary MD    Disposition Home    Diet: Regular Diet`   On your next visit with your primary care physician please Get Medicines reviewed and adjusted.   Please request your Prim.MD to go over all Hospital Tests and Procedure/Radiological results at the follow up, please get all Hospital records sent to your Prim MD by signing hospital release before you go home.   If you experience worsening of your admission symptoms, develop shortness of breath, life threatening emergency, suicidal or homicidal thoughts you must seek medical attention immediately by calling 911 or calling your MD immediately  if symptoms less severe.  You Must read complete instructions/literature along with all the possible adverse reactions/side effects for all the Medicines you take and that have been prescribed to you. Take any new Medicines after you have completely understood and accpet all the possible adverse reactions/side effects.   Do not drive, operating heavy machinery, perform activities at heights, swimming or participation in water activities or provide baby sitting services if your were admitted for syncope or siezures until you have seen by Primary MD or a Neurologist and advised to do so again.  Do not drive when taking Pain medications.    Do not take more than prescribed Pain, Sleep and Anxiety Medications  Special Instructions: If you have smoked or chewed Tobacco  in the last 2 yrs please stop smoking, stop any regular Alcohol  and or any Recreational drug use.  Wear Seat belts while driving.   Please note  You were cared for by a hospitalist during your hospital stay. If you have any questions about your discharge medications or the care you received while you were in the hospital after you are discharged, you can call the unit and asked to speak with the hospitalist on call if the hospitalist that took care of you is not available. Once you are discharged, your primary care  physician will handle any further medical issues. Please note that NO REFILLS for any discharge medications will be authorized once you are discharged, as it is imperative that you return to your primary care physician (or establish a relationship with a primary care physician if you do not have one) for your aftercare needs so that they can reassess your need for medications and monitor your lab values.

## 2023-05-28 NOTE — ED Notes (Signed)
Pt states he is still feeling a little dizzy. Gave pt a new urinal and advised him not to get out of bed unsupervised.

## 2023-05-28 NOTE — ED Provider Notes (Signed)
[place] EMERGENCY DEPARTMENT AT [facility] Provider Note   CSN: [id number] Arrival date & time: 05/28/23  1026     History  No chief complaint on file.   Robert Mahoney is a 27 y.o. male.  He is a paramedic working on our system.  He has a history of hypertension.  He said he tested positive for COVID a few days ago and started a new medication molnupiravir.  He was well when he woke up this morning and then approximately 930 he got up off the couch felt very swimmy headed and unbalanced.  He was dropping things.  He does endorse a occipital pressure in his head but denies that it is pain.  Eyes feel little unfocused but no visual loss.  No numbness or weakness.  He has never had the symptoms before.  He does endorse that he has had some migraines in the past with a visual aura but is not experiencing that now.  He was a stroke activation on presentation.  The history is provided by the patient.  Dizziness Quality:  Lightheadedness Severity:  Moderate Onset quality:  Sudden Duration:  1 hour Timing:  Constant Progression:  Unchanged Chronicity:  New Context: standing up   Relieved by:  Nothing Worsened by:  Movement Ineffective treatments:  None tried Associated symptoms: headaches and vision changes   Associated symptoms: no chest pain, no diarrhea, no nausea, no shortness of breath and no vomiting        Home Medications Prior to Admission medications   Not on File      Allergies    Patient has no known allergies.    Review of Systems   Review of Systems  Respiratory:  Negative for shortness of breath.   Cardiovascular:  Negative for chest pain.  Gastrointestinal:  Negative for diarrhea, nausea and vomiting.  Neurological:  Positive for dizziness and headaches.    Physical Exam Updated Vital Signs BP (!) 145/70   Pulse (!) 128   Wt 89.6 kg   SpO2 98%   BMI 26.06 kg/m  Physical Exam Vitals and nursing note reviewed.  Constitutional:       General: He is not in acute distress.    Appearance: Normal appearance. He is well-developed.  HENT:     Head: Normocephalic and atraumatic.  Eyes:     Conjunctiva/sclera: Conjunctivae normal.  Cardiovascular:     Rate and Rhythm: Regular rhythm. Tachycardia present.     Heart sounds: No murmur heard. Pulmonary:     Effort: Pulmonary effort is normal. No respiratory distress.     Breath sounds: Normal breath sounds.  Abdominal:     Palpations: Abdomen is soft.     Tenderness: There is no abdominal tenderness.  Musculoskeletal:        General: No swelling.     Cervical back: Neck supple.  Skin:    General: Skin is warm and dry.     Capillary Refill: Capillary refill takes less than 2 seconds.  Neurological:     General: No focal deficit present.     Mental Status: He is alert and oriented to person, place, and time.     Cranial Nerves: No cranial nerve deficit.     Sensory: No sensory deficit.     Motor: No weakness.     Coordination: Coordination normal.     Gait: Gait abnormal.     Comments: Patient's gait is not grossly ataxic but is slower than I would suspect  is his baseline and looks more unsure of himself.     ED Results / Procedures / Treatments   Labs (all labs ordered are listed, but only abnormal results are displayed) Labs Reviewed  CBC - Abnormal; Notable for the following components:      Result Value   WBC 11.6 (*)    All other components within normal limits  DIFFERENTIAL - Abnormal; Notable for the following components:   Monocytes Absolute 1.3 (*)    All other components within normal limits  COMPREHENSIVE METABOLIC PANEL - Abnormal; Notable for the following components:   Potassium 3.4 (*)    Glucose, Bld 116 (*)    All other components within normal limits  I-STAT CHEM 8, ED - Abnormal; Notable for the following components:   Glucose, Bld 114 (*)    Calcium, Ion 1.12 (*)    All other components within normal limits  PROTIME-INR  APTT  ETHANOL   RAPID URINE DRUG SCREEN, HOSP PERFORMED  URINALYSIS, ROUTINE W REFLEX MICROSCOPIC  CBG MONITORING, ED  I-STAT CHEM 8, ED    EKG EKG Interpretation Date/Time:  Thursday May 28 2023 11:20:14 EDT Ventricular Rate:  110 PR Interval:  151 QRS Duration:  102 QT Interval:  326 QTC Calculation: 441 R Axis:   82  Text Interpretation: Sinus tachycardia No old tracing to compare Confirmed by Meridee Score [phone]) on 05/28/2023 11:21:19 AM  Radiology MR BRAIN WO CONTRAST  Result Date: 05/28/2023 CLINICAL DATA:  Ataxia. EXAM: MRI HEAD WITHOUT CONTRAST TECHNIQUE: Multiplanar, multiecho pulse sequences of the brain and surrounding structures were obtained without intravenous contrast. COMPARISON:  Same-day CT/CTA head and neck FINDINGS: Brain: There is no acute intracranial hemorrhage, extra-axial fluid collection, or acute infarct. Parenchymal volume is normal. The ventricles are normal in size. Parenchymal signal is normal. The pituitary and suprasellar region are normal. There is no mass lesion. There is no mass effect or midline shift. Vascular: Normal flow voids. Skull and upper cervical spine: Normal marrow signal. Sinuses/Orbits: The paranasal sinuses are clear. The globes and orbits are unremarkable. Other: The mastoid air cells and middle ear cavities are clear. IMPRESSION: Normal brain MRI. Electronically Signed   By: Lesia Hausen M.D.   On: 05/28/2023 16:13   CT ANGIO HEAD NECK W WO CM (CODE STROKE)  Result Date: 05/28/2023 CLINICAL DATA:  Limb ataxia, dizziness EXAM: CT ANGIOGRAPHY HEAD AND NECK WITH AND WITHOUT CONTRAST TECHNIQUE: Multidetector CT imaging of the head and neck was performed using the standard protocol during bolus administration of intravenous contrast. Multiplanar CT image reconstructions and MIPs were obtained to evaluate the vascular anatomy. Carotid stenosis measurements (when applicable) are obtained utilizing NASCET criteria, using the distal internal carotid diameter  as the denominator. RADIATION DOSE REDUCTION: This exam was performed according to the departmental dose-optimization program which includes automated exposure control, adjustment of the mA and/or kV according to patient size and/or use of iterative reconstruction technique. CONTRAST:  75mL OMNIPAQUE IOHEXOL 350 MG/ML SOLN COMPARISON:  Same-day noncontrast CT head FINDINGS: CTA NECK FINDINGS Aortic arch: Imaged aortic arch is normal. The origins of the major branch vessels are patent. The subclavian arteries are patent to the level imaged. Right carotid system: The right common, internal, and external carotid arteries are patent, without hemodynamically significant stenosis or occlusion. There is no evidence of dissection or aneurysm. Left carotid system: The left common, internal, and external carotid arteries are patent, without hemodynamically significant stenosis or occlusion there is no evidence of dissection or aneurysm.  Vertebral arteries: The vertebral arteries are patent, without hemodynamically significant stenosis or occlusion there is no evidence of dissection or aneurysm. Skeleton: There is no acute osseous abnormality or suspicious osseous lesion. Other neck: Soft tissues of the neck are unremarkable. Upper chest: The imaged lung apices are clear. Review of the MIP images confirms the above findings CTA HEAD FINDINGS Anterior circulation: The intracranial ICAs are normal. The bilateral MCAs and ACAS are normal. The anterior communicating artery is normal There is no aneurysm or AVM. Posterior circulation: The bilateral V4 segments are normal. The basilar artery is normal. The major cerebellar arteries are normal. The bilateral PCAs are normal. Diminutive posterior communicating arteries are identified. There is no aneurysm or AVM. Venous sinuses: Patent. Anatomic variants: None. Review of the MIP images confirms the above findings IMPRESSION: Normal CTA of the head and neck. Electronically Signed   By:  Lesia Hausen M.D.   On: 05/28/2023 11:17   CT HEAD CODE STROKE WO CONTRAST  Result Date: 05/28/2023 CLINICAL DATA:  Code stroke.  Neuro deficit, acute, stroke suspected EXAM: CT HEAD WITHOUT CONTRAST TECHNIQUE: Contiguous axial images were obtained from the base of the skull through the vertex without intravenous contrast. RADIATION DOSE REDUCTION: This exam was performed according to the departmental dose-optimization program which includes automated exposure control, adjustment of the mA and/or kV according to patient size and/or use of iterative reconstruction technique. COMPARISON:  None Available. FINDINGS: Brain: No evidence of acute infarction, hemorrhage, hydrocephalus, extra-axial collection or mass lesion/mass effect. Vascular: No hyperdense vessel or unexpected calcification. Skull: Normal. Negative for fracture or focal lesion. Sinuses/Orbits: No middle ear or mastoid effusion. Paranasal sinuses are clear orbits are unremarkable Other: None. ASPECTS [facility] Stroke Program Early CT Score): 10 IMPRESSION: No hemorrhage or CT evidence of an acute cortical infarct. Findings were paged to Dr. Roda Shutters on 05/28/23 at 10:46 AM via [facility] paging system. Electronically Signed   By: Lorenza Cambridge M.D.   On: 05/28/2023 10:46    Procedures .Critical Care  Performed by: Terrilee Files, MD Authorized by: Terrilee Files, MD   Critical care provider statement:    Critical care time (minutes):  45   Critical care time was exclusive of:  Separately billable procedures and treating other patients   Critical care was necessary to treat or prevent imminent or life-threatening deterioration of the following conditions:  CNS failure or compromise   Critical care was time spent personally by me on the following activities:  Development of treatment plan with patient or surrogate, discussions with consultants, evaluation of patient's response to treatment, examination of patient, obtaining history from patient or  surrogate, ordering and performing treatments and interventions, ordering and review of laboratory studies, ordering and review of radiographic studies, pulse oximetry, re-evaluation of patient's condition and review of old charts   I assumed direction of critical care for this patient from another provider in my specialty: no       Medications Ordered in ED Medications  sodium chloride flush (NS) 0.9 % injection 3 mL (3 mLs Intravenous Not Given 05/28/23 1115)  LORazepam (ATIVAN) injection 1 mg (1 mg Intravenous Given 05/28/23 1456)  metoCLOPramide (REGLAN) injection 10 mg (has no administration in time range)  diphenhydrAMINE (BENADRYL) injection 25 mg (has no administration in time range)  iohexol (OMNIPAQUE) 350 MG/ML injection 75 mL (75 mLs Intravenous Contrast Given 05/28/23 1104)    ED Course/ Medical Decision Making/ A&P Clinical Course as of 05/28/23 1657  Thu May 28, 2023  1135 Patient was seen and evaluated by Dr. Roda Shutters neurology.  CT and CTA are negative.  He is going to get an MRI.  He is leaning towards this is a migraine variant.  If MRI negative he feels he can be discharged. [MB]    Clinical Course User Index [MB] Terrilee Files, MD                             Medical Decision Making Amount and/or Complexity of Data Reviewed Labs: ordered. Radiology: ordered.  Risk Prescription drug management.   This patient complains of unsteady gait, dizziness, decreased coordination; this involves an extensive number of treatment Options and is a complaint that carries with it a high risk of complications and morbidity. The differential includes stroke, bleed, seizure, migraine, encephalitis  I ordered, reviewed and interpreted labs, which included CBC with mildly elevated white count stable hemoglobin, chemistries with mildly low potassium, talk screen negative, glucose normal I ordered medication IV Ativan and reviewed PMP when indicated. I ordered imaging studies which  included CT CTA and MRI brain and I independently    visualized and interpreted imaging which showed no acute findings Additional history obtained from patient's coworkers Previous records obtained and reviewed in epic no recent admissions I consulted neurology Dr. Roda Shutters And discussed lab and imaging findings and discussed disposition.  Cardiac monitoring reviewed, sinus tachycardia improving to normal sinus rhythm Social determinants considered, no significant barriers Critical Interventions: Initiation of code stroke protocol requiring rapid assessment and imaging, complex decision making  After the interventions stated above, I reevaluated the patient and found patient still to feel off although does not have any hard neurologic findings Admission and further testing considered, his care is signed out to Dr. Adela Lank to follow-up on results of MRI reading.  If the patient is not back to baseline he may end up needing admission for further workup.         Final Clinical Impression(s) / ED Diagnoses Final diagnoses:  Dizziness  Unsteady gait when walking    Rx / DC Orders ED Discharge Orders     None         Terrilee Files, MD 05/28/23 1701

## 2023-05-28 NOTE — ED Notes (Signed)
ED TO INPATIENT HANDOFF REPORT  ED Nurse Name and Phone #:   Gillis Ends [phone]  S Name/Age/Gender Robert Mahoney 27 y.o. male Room/Bed: 043C/043C  Code Status   Code Status: Full Code  Home/SNF/Other Home Patient oriented to: self, place, time, and situation Is this baseline? Yes   Triage Complete: Triage complete  Chief Complaint Vertigo [R42]  Triage Note Pt LNK 0940 today, felt dizzy, hypertensive at home, ataxia, confusion, aphasia, feeling of fullness in head. Numbness on L cheek and arm.    Allergies Allergies  Allergen Reactions   Soy Allergy Anaphylaxis    Level of Care/Admitting Diagnosis ED Disposition     ED Disposition  Admit   Condition  --   Comment  Hospital Area: [facility] [[id number]]  Level of Care: Telemetry Medical [104]  May place patient in observation at [facility] or [facility] if equivalent level of care is available:: No  Covid Evaluation: Confirmed COVID Positive  Diagnosis: Vertigo [207257]  Admitting Physician: Rolly Salter [[id number]]  Attending Physician: Rolly Salter [[id number]]          B Medical/Surgery History Past Medical History:  Diagnosis Date   Hypertension    History reviewed. No pertinent surgical history.   A IV Location/Drains/Wounds Patient Lines/Drains/Airways Status     Active Line/Drains/Airways     Name Placement date Placement time Site Days   Peripheral IV 05/28/23 18 G Left Antecubital 05/28/23  1112  Antecubital  less than 1            Intake/Output Last 24 hours No intake or output data in the 24 hours ending 05/28/23 2101  Labs/Imaging Results for orders placed or performed during the hospital encounter of 05/28/23 (from the past 48 hour(s))  Protime-INR     Status: None   Collection Time: 05/28/23 10:34 AM  Result Value Ref Range   Prothrombin Time 13.4 11.4 - 15.2 seconds   INR 1.0 0.8 - 1.2    Comment: (NOTE) INR goal varies based on device and disease  states. Performed at [facility], [address], [place], [place] [address]   APTT     Status: None   Collection Time: 05/28/23 10:34 AM  Result Value Ref Range   aPTT 27 24 - 36 seconds    Comment: Performed at [facility], [address] [place], [place] [address]  CBC     Status: Abnormal   Collection Time: 05/28/23 10:34 AM  Result Value Ref Range   WBC 11.6 (H) 4.0 - 10.5 K/uL   RBC 5.56 4.22 - 5.81 MIL/uL   Hemoglobin 15.4 13.0 - 17.0 g/dL   HCT 29.5 28.4 - 13.2 %   MCV 80.9 80.0 - 100.0 fL   MCH 27.7 26.0 - 34.0 pg   MCHC 34.2 30.0 - 36.0 g/dL   RDW 44.0 10.2 - 72.5 %   Platelets 258 150 - 400 K/uL   nRBC 0.0 0.0 - 0.2 %    Comment: Performed at [facility], [address], [place], [place] [address]  Differential     Status: Abnormal   Collection Time: 05/28/23 10:34 AM  Result Value Ref Range   Neutrophils Relative % 59 %   Neutro Abs 6.9 1.7 - 7.7 K/uL   Lymphocytes Relative 25 %   Lymphs Abs 2.9 0.7 - 4.0 K/uL   Monocytes Relative 11 %   Monocytes Absolute 1.3 (H) 0.1 - 1.0 K/uL  Eosinophils Relative 4 %   Eosinophils Absolute 0.4 0.0 - 0.5 K/uL   Basophils Relative 1 %   Basophils Absolute 0.1 0.0 - 0.1 K/uL   Immature Granulocytes 0 %   Abs Immature Granulocytes 0.04 0.00 - 0.07 K/uL    Comment: Performed at [facility], [address] [place], [place] [address]  Comprehensive metabolic panel     Status: Abnormal   Collection Time: 05/28/23 10:34 AM  Result Value Ref Range   Sodium 136 135 - 145 mmol/L   Potassium 3.4 (L) 3.5 - 5.1 mmol/L   Chloride 101 98 - 111 mmol/L   CO2 25 22 - 32 mmol/L   Glucose, Bld 116 (H) 70 - 99 mg/dL    Comment: Glucose reference range applies only to samples taken after fasting for at least 8 hours.   BUN 11 6 - 20 mg/dL   Creatinine, Ser 6.04 0.61 - 1.24 mg/dL   Calcium 9.2 8.9 - 54.0 mg/dL   Total Protein 7.8 6.5 - 8.1 g/dL   Albumin 4.4 3.5 - 5.0 g/dL   AST 24 15 - 41 U/L   ALT 39 0 -  44 U/L   Alkaline Phosphatase 65 38 - 126 U/L   Total Bilirubin 0.7 0.3 - 1.2 mg/dL   GFR, Estimated >98 >11 mL/min    Comment: (NOTE) Calculated using the CKD-EPI Creatinine Equation (2021)    Anion gap 10 5 - 15    Comment: Performed at [facility], [address] [place], [place] [address]  Ethanol     Status: None   Collection Time: 05/28/23 10:34 AM  Result Value Ref Range   Alcohol, Ethyl (B) <10 <10 mg/dL    Comment: (NOTE) Lowest detectable limit for serum alcohol is 10 mg/dL.  For medical purposes only. Performed at [facility], [address] [place], [place] [address]   Urine rapid drug screen (hosp performed)     Status: None   Collection Time: 05/28/23 10:34 AM  Result Value Ref Range   Opiates NONE DETECTED NONE DETECTED   Cocaine NONE DETECTED NONE DETECTED   Benzodiazepines NONE DETECTED NONE DETECTED   Amphetamines NONE DETECTED NONE DETECTED   Tetrahydrocannabinol NONE DETECTED NONE DETECTED   Barbiturates NONE DETECTED NONE DETECTED    Comment: (NOTE) DRUG SCREEN FOR MEDICAL PURPOSES ONLY.  IF CONFIRMATION IS NEEDED FOR ANY PURPOSE, NOTIFY LAB WITHIN 5 DAYS.  LOWEST DETECTABLE LIMITS FOR URINE DRUG SCREEN Drug Class                     Cutoff (ng/mL) Amphetamine and metabolites    1000 Barbiturate and metabolites    200 Benzodiazepine                 200 Opiates and metabolites        300 Cocaine and metabolites        300 THC                            50 Performed at [facility], [address], [place], [place] [address]   I-stat chem 8, ED     Status: Abnormal   Collection Time: 05/28/23 10:52 AM  Result Value Ref Range   Sodium 140 135 - 145 mmol/L   Potassium 3.5 3.5 - 5.1 mmol/L   Chloride 102 98 - 111 mmol/L   BUN 13 6 -  20 mg/dL   Creatinine, Ser 1.61 0.61 - 1.24 mg/dL   Glucose, Bld 096 (H) 70 - 99 mg/dL    Comment: Glucose reference range applies only to samples taken after fasting for at least 8 hours.    Calcium, Ion 1.12 (L) 1.15 - 1.40 mmol/L   TCO2 26 22 - 32 mmol/L   Hemoglobin 15.6 13.0 - 17.0 g/dL   HCT 04.5 40.9 - 81.1 %   X-ray chest PA and lateral  Result Date: 05/28/2023 CLINICAL DATA:  Cough EXAM: CHEST - 2 VIEW COMPARISON:  10/31/2022 FINDINGS: The heart size and mediastinal contours are within normal limits. Both lungs are clear. Mild dextro-scoliotic midthoracic curvature. IMPRESSION: No active cardiopulmonary disease. Electronically Signed   By: Duanne Guess D.O.   On: 05/28/2023 19:51   MR BRAIN WO CONTRAST  Result Date: 05/28/2023 CLINICAL DATA:  Ataxia. EXAM: MRI HEAD WITHOUT CONTRAST TECHNIQUE: Multiplanar, multiecho pulse sequences of the brain and surrounding structures were obtained without intravenous contrast. COMPARISON:  Same-day CT/CTA head and neck FINDINGS: Brain: There is no acute intracranial hemorrhage, extra-axial fluid collection, or acute infarct. Parenchymal volume is normal. The ventricles are normal in size. Parenchymal signal is normal. The pituitary and suprasellar region are normal. There is no mass lesion. There is no mass effect or midline shift. Vascular: Normal flow voids. Skull and upper cervical spine: Normal marrow signal. Sinuses/Orbits: The paranasal sinuses are clear. The globes and orbits are unremarkable. Other: The mastoid air cells and middle ear cavities are clear. IMPRESSION: Normal brain MRI. Electronically Signed   By: Lesia Hausen M.D.   On: 05/28/2023 16:13   CT ANGIO HEAD NECK W WO CM (CODE STROKE)  Result Date: 05/28/2023 CLINICAL DATA:  Limb ataxia, dizziness EXAM: CT ANGIOGRAPHY HEAD AND NECK WITH AND WITHOUT CONTRAST TECHNIQUE: Multidetector CT imaging of the head and neck was performed using the standard protocol during bolus administration of intravenous contrast. Multiplanar CT image reconstructions and MIPs were obtained to evaluate the vascular anatomy. Carotid stenosis measurements (when applicable) are obtained utilizing  NASCET criteria, using the distal internal carotid diameter as the denominator. RADIATION DOSE REDUCTION: This exam was performed according to the departmental dose-optimization program which includes automated exposure control, adjustment of the mA and/or kV according to patient size and/or use of iterative reconstruction technique. CONTRAST:  75mL OMNIPAQUE IOHEXOL 350 MG/ML SOLN COMPARISON:  Same-day noncontrast CT head FINDINGS: CTA NECK FINDINGS Aortic arch: Imaged aortic arch is normal. The origins of the major branch vessels are patent. The subclavian arteries are patent to the level imaged. Right carotid system: The right common, internal, and external carotid arteries are patent, without hemodynamically significant stenosis or occlusion. There is no evidence of dissection or aneurysm. Left carotid system: The left common, internal, and external carotid arteries are patent, without hemodynamically significant stenosis or occlusion there is no evidence of dissection or aneurysm. Vertebral arteries: The vertebral arteries are patent, without hemodynamically significant stenosis or occlusion there is no evidence of dissection or aneurysm. Skeleton: There is no acute osseous abnormality or suspicious osseous lesion. Other neck: Soft tissues of the neck are unremarkable. Upper chest: The imaged lung apices are clear. Review of the MIP images confirms the above findings CTA HEAD FINDINGS Anterior circulation: The intracranial ICAs are normal. The bilateral MCAs and ACAS are normal. The anterior communicating artery is normal There is no aneurysm or AVM. Posterior circulation: The bilateral V4 segments are normal. The basilar artery is normal. The major cerebellar arteries are  normal. The bilateral PCAs are normal. Diminutive posterior communicating arteries are identified. There is no aneurysm or AVM. Venous sinuses: Patent. Anatomic variants: None. Review of the MIP images confirms the above findings IMPRESSION:  Normal CTA of the head and neck. Electronically Signed   By: Lesia Hausen M.D.   On: 05/28/2023 11:17   CT HEAD CODE STROKE WO CONTRAST  Result Date: 05/28/2023 CLINICAL DATA:  Code stroke.  Neuro deficit, acute, stroke suspected EXAM: CT HEAD WITHOUT CONTRAST TECHNIQUE: Contiguous axial images were obtained from the base of the skull through the vertex without intravenous contrast. RADIATION DOSE REDUCTION: This exam was performed according to the departmental dose-optimization program which includes automated exposure control, adjustment of the mA and/or kV according to patient size and/or use of iterative reconstruction technique. COMPARISON:  None Available. FINDINGS: Brain: No evidence of acute infarction, hemorrhage, hydrocephalus, extra-axial collection or mass lesion/mass effect. Vascular: No hyperdense vessel or unexpected calcification. Skull: Normal. Negative for fracture or focal lesion. Sinuses/Orbits: No middle ear or mastoid effusion. Paranasal sinuses are clear orbits are unremarkable Other: None. ASPECTS [facility] Stroke Program Early CT Score): 10 IMPRESSION: No hemorrhage or CT evidence of an acute cortical infarct. Findings were paged to Dr. Roda Shutters on 05/28/23 at 10:46 AM via [facility] paging system. Electronically Signed   By: Lorenza Cambridge M.D.   On: 05/28/2023 10:46    Pending Labs Unresulted Labs (From admission, onward)     Start     Ordered   05/28/23 1913  Vitamin B12  Once,   R        05/28/23 1912   05/28/23 1913  Ammonia  Once,   R        05/28/23 1912   05/28/23 1906  C-reactive protein  Once,   R        05/28/23 1906   05/28/23 1906  D-dimer, quantitative  Once,   R        05/28/23 1906   05/28/23 1904  HIV Antibody (routine testing w rflx)  (HIV Antibody (Routine testing w reflex) panel)  Once,   R        05/28/23 1905   05/28/23 1857  SARS Coronavirus 2 by RT PCR (hospital order, performed in [facility] hospital lab) *cepheid single result test* Anterior Nasal Swab  (Tier  2 - SARS Coronavirus 2 by RT PCR (hospital order, performed in [facility] hospital lab) *cepheid single result test*)  Once,   R        05/28/23 1857   05/28/23 1034  Urinalysis, Routine w reflex microscopic -Urine, Clean Catch  Once,   URGENT       Question:  Specimen Source  Answer:  Urine, Clean Catch   05/28/23 1034            Vitals/Pain Today's Vitals   05/28/23 1500 05/28/23 1737 05/28/23 1800 05/28/23 1902  BP:   130/75 (!) 106/59  Pulse:   (!) 103 (!) 102  Resp:   16 18  Temp: 98.1 F (36.7 C)   98.7 F (37.1 C)  TempSrc: Oral   Oral  SpO2:   97% 94%  Weight:      Height:      PainSc:  0-No pain      Isolation Precautions Airborne and Contact precautions  Medications Medications  sodium chloride flush (NS) 0.9 % injection 3 mL (3 mLs Intravenous Not Given 05/28/23 1115)  potassium chloride SA (KLOR-CON M) CR tablet 40 mEq (40 mEq  Oral Patient Refused/Not Given 05/28/23 2032)  amLODipine (NORVASC) tablet 5 mg (has no administration in time range)  fluticasone (FLONASE) 50 MCG/ACT nasal spray 1 spray (1 spray Each Nare Not Given 05/28/23 2032)  enoxaparin (LOVENOX) injection 40 mg (40 mg Subcutaneous Not Given 05/28/23 2032)  lactated ringers infusion ( Intravenous Not Given 05/28/23 2032)  acetaminophen (TYLENOL) tablet 650 mg (has no administration in time range)    Or  acetaminophen (TYLENOL) suppository 650 mg (has no administration in time range)  senna-docusate (Senokot-S) tablet 1 tablet (has no administration in time range)  ondansetron (ZOFRAN) tablet 4 mg (has no administration in time range)    Or  ondansetron (ZOFRAN) injection 4 mg (has no administration in time range)  dexamethasone (DECADRON) tablet 6 mg (6 mg Oral Not Given 05/28/23 2031)  benzonatate (TESSALON) capsule 100 mg (has no administration in time range)  guaiFENesin (MUCINEX) 12 hr tablet 1,200 mg (has no administration in time range)  acetaminophen (TYLENOL) tablet 1,000 mg (1,000 mg Oral  Not Given 05/28/23 2031)  **Patient Supplied** molnupiravir EUA (LAGEVRIO) capsule 800 mg (has no administration in time range)  iohexol (OMNIPAQUE) 350 MG/ML injection 75 mL (75 mLs Intravenous Contrast Given 05/28/23 1104)  metoCLOPramide (REGLAN) injection 10 mg (10 mg Intravenous Given 05/28/23 1708)  diphenhydrAMINE (BENADRYL) injection 25 mg (25 mg Intravenous Given 05/28/23 1708)    Mobility walks     Focused Assessments    R Recommendations: See Admitting Provider Note  Report given to:   Additional Notes:

## 2023-05-28 NOTE — H&P (Addendum)
History and Physical  Patient: Robert Mahoney AOZ:[id number] DOB: 1996-02-06 DOA: 05/28/2023 DOS: the patient was seen and examined on 05/28/2023 Patient coming from: Home  Chief Complaint: Dizziness ED TRIAGE note : "Pt LNK 0940 today, felt dizzy, hypertensive at home, ataxia, confusion, aphasia, feeling of fullness in head. Numbness on L cheek and arm.  " HPI: Robert Mahoney is a 27 y.o. male with PMH significant of hypertension and recent diagnosis of COVID.  Present to the hospital with complaints of dizziness. Patient mentions that around 945-10 AM he started having symptoms of double vision this was followed by confusion, slurred speech, difficulty ambulating with balance issues.  He also had some fullness of his head and ears.  Reportedly he had some numbness of his left cheek and arm. Reportedly by the time he arrived to the ER in the ER he was having significant tremors and difficulty ambulating. Patient was seen as a code stroke.  Neurology evaluated the patient.  MRI brain was negative for acute stroke. At the time of my evaluation patient continues to complain of some dizziness and lightheadedness.  No vertigo.  No numbness.  Reported some left-sided headache which feels like a tension headache.  No nausea at the time of my evaluation.  No chest pain.  No abdominal pain.  Continues to have some dry cough.  Continues to have some double vision bilaterally. Denies any diarrhea denies any burning urination, denies any bleeding anywhere, denies any leg swelling or leg pain. Denies any alcohol abuse denies any significant history of smoking. Denies any substance abuse.  He mentioned that he took promethazine and Delsym at around 8 AM before the onset of the symptoms to help with his cough. He does not take Delsym on a regular basis for cough and promethazine has been in storage for a while. He was also given molnupiravir for his COVID which she started taking on 7/8. He was not given any  steroids. Significant other at the bedside reports that the patient is not as "sharp" as he usually is and patient also feels that he is somewhat " foggy ".  Review of Systems: As mentioned in the history of present illness. All other systems reviewed and are negative.  Prior to Admission medications   Medication Sig Start Date End Date Taking? Authorizing Provider  albuterol (VENTOLIN HFA) 108 (90 Base) MCG/ACT inhaler Inhale 2 puffs into the lungs every 4 (four) hours as needed for wheezing or shortness of breath. 11/01/21  Yes [provider]  amLODipine (NORVASC) 10 MG tablet Take 10 mg by mouth daily.   Yes [provider]  cetirizine (ZYRTEC) 10 MG tablet Take 10 mg by mouth daily.   Yes [provider]  EPINEPHrine 0.3 mg/0.3 mL IJ SOAJ injection Inject 0.3 mg into the muscle as needed for anaphylaxis (Use only as needed for anaphylaxis). 10/24/21  Yes [provider]  fluticasone (FLONASE ALLERGY RELIEF) 50 MCG/ACT nasal spray Place 1 spray into both nostrils daily.   Yes [provider]  losartan (COZAAR) 50 MG tablet Take 50 mg by mouth 2 (two) times daily.   Yes [provider]  minoxidil (LONITEN) 2.5 MG tablet Take 2.5 mg by mouth daily. 07/10/22  Yes [provider]  montelukast (SINGULAIR) 10 MG tablet Take 10 mg by mouth daily.   Yes [provider]  ondansetron (ZOFRAN-ODT) 4 MG disintegrating tablet Take 4 mg by mouth every 8 (eight) hours as needed for refractory nausea / vomiting.  05/25/23 06/01/23 Yes [provider]  promethazine (PHENERGAN) 6.25 MG/5ML solution Take 5 mLs by mouth daily.   Yes [provider]  pseudoephedrine-guaifenesin (MUCINEX D) 60-600 MG 12 hr tablet Take 1 tablet by mouth every 12 (twelve) hours.   Yes [provider]    Past Medical History:  Diagnosis Date   Hypertension    History reviewed. No pertinent surgical history. Social History:  reports that  he has never smoked. He has never used smokeless tobacco. He reports that he does not currently use alcohol. He reports that he does not currently use drugs. Allergies  Allergen Reactions   Soy Allergy Anaphylaxis   History reviewed. No pertinent family history. Physical Exam: Vitals:   05/28/23 1445 05/28/23 1500 05/28/23 1800 05/28/23 1902  BP:   130/75 (!) 106/59  Pulse: 94  (!) 103 (!) 102  Resp: 18  16 18   Temp:  98.1 F (36.7 C)  98.7 F (37.1 C)  TempSrc:  Oral  Oral  SpO2: 97%  97% 94%  Weight:      Height:       General: Appear in mild distress; no visible Abnormal Neck Mass Or lumps, Conjunctiva normal Cardiovascular: S1 and S2 Present, no Murmur, Respiratory: good respiratory effort, Bilateral Air entry present and CTA, no Crackles, no wheezes Abdomen: Bowel Sound present, Non tender  Extremities: no Pedal edema Neurology: alert and oriented to time, place, and person Mental status AAOx3, speech normal, attention normal,  Cranial Nerves PERRL, EOM normal and present, facial sensation to light touch present,  Motor strength bilateral equal strength 5/5,  Sensation present to light touch,  Cerebellar test normal finger nose finger. No tremors, no asterixis.  No meningismus, no nystagmus. Gait not checked due to patient safety concerns   Data Reviewed: I have reviewed ED notes, Vitals, Lab results and outpatient records. Since last encounter, pertinent lab results CBC and BMP   . I have ordered test including CBC and BMP and D-dimer CRP  . I have discussed pt's care plan and test results with ED provider  . I have ordered imaging chest x-ray  .   Assessment and Plan Dizziness Sinus tachycardia Presents with complaints of dizziness. Currently I do not appreciate any nystagmus the time of my evaluation. Finger-nose-finger is normal as well. No slurred speech. MRI brain negative for any acute abnormality. The timing of his presentation with him taking Delsym and  promethazine at 8 AM with symptoms starting at around 945 makes me think that this is most likely medication side effect. Although his symptoms are still persisting at 11-hour mark from him taking his medications. Therefore will initiate further workup to ensure no other acute abnormality. Vestibular neuritis is in differential in the setting of viral infection although again no nystagmus, no significant peripheral vertigo symptoms. Will empirically treat with dexamethasone and monitor. PT vestibular rehab ordered. In the setting of COVID infection possibility of a PE in the setting of sinus tachycardia cannot be ruled out as well. Patient is not hypoxic. Will check D-dimer and if elevated recommend a CT PE protocol.  Toxic encephalopathy Suspect in the setting of side effect from promethazine. Check B12, ammonia level. Monitor on telemetry. Avoid psychotropic medications.  COVID-19 virus infection Diagnosed on 7/7 at home test. Micah Flesher to urgent care on 7/8 and was started on cetirizine, Zofran as well as molnupiravir. Will be on isolation until 7/17 in the hospital. Will check CRP, D-dimer. Will recheck COVID test to monitor CT  value. Will resumption of molnupiravir to complete his 5-day therapy. Patient has been informed that he will have to bring his home molnupiravir.  Essential hypertension Blood pressure was elevated at the time of admission. Currently blood pressure soft. Will check orthostatic vitals. Resuming his Norvasc starting tomorrow but at a lower dose. Provide IV fluids.  Advance Care Planning:   Code Status: Full Code  Consults: Neurology was consulted in ED, per ED provider neurology will follow. Family Communication: Significant other at bedside.  Author: Lynden Oxford, MD 05/28/2023 7:21 PM For on call review [email or web address].

## 2023-05-28 NOTE — ED Notes (Signed)
CBG 102 

## 2023-05-29 ENCOUNTER — Encounter (HOSPITAL_COMMUNITY): Payer: Self-pay | Admitting: Internal Medicine

## 2023-05-29 DIAGNOSIS — G929 Unspecified toxic encephalopathy: Secondary | ICD-10-CM | POA: Diagnosis not present

## 2023-05-29 DIAGNOSIS — I10 Essential (primary) hypertension: Secondary | ICD-10-CM

## 2023-05-29 DIAGNOSIS — U071 COVID-19: Secondary | ICD-10-CM

## 2023-05-29 DIAGNOSIS — R42 Dizziness and giddiness: Secondary | ICD-10-CM | POA: Diagnosis not present

## 2023-05-29 LAB — BASIC METABOLIC PANEL
Anion gap: 6 (ref 5–15)
BUN: 15 mg/dL (ref 6–20)
CO2: 26 mmol/L (ref 22–32)
Calcium: 9.4 mg/dL (ref 8.9–10.3)
Chloride: 103 mmol/L (ref 98–111)
Creatinine, Ser: 0.97 mg/dL (ref 0.61–1.24)
GFR, Estimated: >60 mL/min (ref >60)
Glucose, Bld: 140 mg/dL — ABNORMAL HIGH (ref 70–99)
Potassium: 4.5 mmol/L (ref 3.5–5.1)
Sodium: 135 mmol/L (ref 135–145)

## 2023-05-29 LAB — CBC WITH DIFFERENTIAL/PLATELET
Abs Immature Granulocytes: 0.03 10*3/uL (ref 0.00–0.07)
Basophils Absolute: 0 10*3/uL (ref 0.0–0.1)
Basophils Relative: 0 %
Eosinophils Absolute: 0 10*3/uL (ref 0.0–0.5)
Eosinophils Relative: 0 %
HCT: 40.8 % (ref 39.0–52.0)
Hemoglobin: 14.3 g/dL (ref 13.0–17.0)
Immature Granulocytes: 0 %
Lymphocytes Relative: 11 %
Lymphs Abs: 1.1 10*3/uL (ref 0.7–4.0)
MCH: 27.9 pg (ref 26.0–34.0)
MCHC: 35 g/dL (ref 30.0–36.0)
MCV: 79.7 fL — ABNORMAL LOW (ref 80.0–100.0)
Monocytes Absolute: 0.1 10*3/uL (ref 0.1–1.0)
Monocytes Relative: 1 %
Neutro Abs: 8.8 10*3/uL — ABNORMAL HIGH (ref 1.7–7.7)
Neutrophils Relative %: 88 %
Platelets: 233 10*3/uL (ref 150–400)
RBC: 5.12 MIL/uL (ref 4.22–5.81)
RDW: 11.9 % (ref 11.5–15.5)
WBC: 10.1 10*3/uL (ref 4.0–10.5)
nRBC: 0 % (ref 0.0–0.2)

## 2023-05-29 LAB — D-DIMER, QUANTITATIVE: D-Dimer, Quant: <0.27 ug/mL-FEU (ref 0.00–0.50)

## 2023-05-29 NOTE — Discharge Summary (Signed)
Physician Discharge Summary  Robert Mahoney:[id number] DOB: 1996-01-30 DOA: 05/28/2023  PCP: Patient, No Pcp Per  Admit date: 05/28/2023 Discharge date: 05/29/2023  Recommendations for Outpatient Follow-up:  Follow up with PCP   Discharge Condition: (Stable) Diet recommendation: Heart Healthy   Brief/Interim Summary:  Robert Mahoney is a 27 y.o. male with PMH significant of hypertension and recent diagnosis of COVID.  Present to the hospital with complaints of dizziness. Patient mentions that around 945-10 AM he started having symptoms of double vision this was followed by confusion, slurred speech, difficulty ambulating with balance issues.  He also had some fullness of his head and ears.  Reportedly he had some numbness of his left cheek and arm. Reportedly by the time he arrived to the ER in the ER he was having significant tremors and difficulty ambulating. Patient was seen as a code stroke.  Neurology evaluated the patient.  MRI brain was negative for acute stroke. He mentioned that he took promethazine and Delsym at around 8 AM before the onset of the symptoms to help with his cough.  -Patient was admitted for further workup, where his workup was reassuring, with negative CTA head and neck, MRI brain, no significant lab abnormalities, negative D-dimers, his symptoms was felt most likely related to his medications including calcium and promethazine.  Dizziness Toxic  encephalopathy -No focal neurological deficits, MRI is negative, as well as CTA head and neck, he was monitored on telemetry which was with no significant arrhythmias or bradycardia or pauses, D-dimers was negative as well. -Is not orthostatic -Times has resolved, this was all felt secondary to medication as he took both calcium and promethazine together, discussed at length with the patient during that period    COVID-19 virus infection Diagnosed on 7/7 at home test. CRP, D-dimers all within normal limit, no  hypoxia, no acute finding on x-ray.   Essential hypertension Continue with home medications  Discharge Diagnoses:  Principal Problem:   Dizziness Active Problems:   Toxic encephalopathy   COVID-19 virus infection   Essential hypertension    Discharge Instructions  Discharge Instructions     Ambulatory referral to Neurology   Complete by: As directed    An appointment is requested in approximately: 4 weeks   Diet - low sodium heart healthy   Complete by: As directed    Discharge instructions   Complete by: As directed    Follow with Primary MD   Disposition Home    Diet: Heart Healthy    On your next visit with your primary care physician please Get Medicines reviewed and adjusted.   Please request your Prim.MD to go over all Hospital Tests and Procedure/Radiological results at the follow up, please get all Hospital records sent to your Prim MD by signing hospital release before you go home.   If you experience worsening of your admission symptoms, develop shortness of breath, life threatening emergency, suicidal or homicidal thoughts you must seek medical attention immediately by calling 911 or calling your MD immediately  if symptoms less severe.  You Must read complete instructions/literature along with all the possible adverse reactions/side effects for all the Medicines you take and that have been prescribed to you. Take any new Medicines after you have completely understood and accpet all the possible adverse reactions/side effects.   Do not drive, operating heavy machinery, perform activities at heights, swimming or participation in water activities or provide baby sitting services if your were admitted for syncope or siezures until  you have seen by Primary MD or a Neurologist and advised to do so again.  Do not drive when taking Pain medications.    Do not take more than prescribed Pain, Sleep and Anxiety Medications  Special Instructions: If you have smoked  or chewed Tobacco  in the last 2 yrs please stop smoking, stop any regular Alcohol  and or any Recreational drug use.  Wear Seat belts while driving.   Please note  You were cared for by a hospitalist during your hospital stay. If you have any questions about your discharge medications or the care you received while you were in the hospital after you are discharged, you can call the unit and asked to speak with the hospitalist on call if the hospitalist that took care of you is not available. Once you are discharged, your primary care physician will handle any further medical issues. Please note that NO REFILLS for any discharge medications will be authorized once you are discharged, as it is imperative that you return to your primary care physician (or establish a relationship with a primary care physician if you do not have one) for your aftercare needs so that they can reassess your need for medications and monitor your lab values.   Increase activity slowly   Complete by: As directed       Allergies as of 05/29/2023       Reactions   Soy Allergy Anaphylaxis        Medication List     TAKE these medications    albuterol 108 (90 Base) MCG/ACT inhaler Commonly known as: VENTOLIN HFA Inhale 2 puffs into the lungs every 4 (four) hours as needed for wheezing or shortness of breath.   amLODipine 10 MG tablet Commonly known as: NORVASC Take 10 mg by mouth daily.   cetirizine 10 MG tablet Commonly known as: ZYRTEC Take 10 mg by mouth daily.   EPINEPHrine 0.3 mg/0.3 mL Soaj injection Commonly known as: EPI-PEN Inject 0.3 mg into the muscle as needed for anaphylaxis (Use only as needed for anaphylaxis).   Flonase Allergy Relief 50 MCG/ACT nasal spray Generic drug: fluticasone Place 1 spray into both nostrils daily.   losartan 50 MG tablet Commonly known as: COZAAR Take 50 mg by mouth 2 (two) times daily.   minoxidil 2.5 MG tablet Commonly known as: LONITEN Take 2.5 mg by  mouth daily.   molnupiravir EUA 200 MG Caps capsule Commonly known as: LAGEVRIO Take 4 capsules by mouth 2 (two) times daily.   montelukast 10 MG tablet Commonly known as: SINGULAIR Take 10 mg by mouth daily.   ondansetron 4 MG disintegrating tablet Commonly known as: ZOFRAN-ODT Take 4 mg by mouth every 8 (eight) hours as needed for refractory nausea / vomiting.   promethazine 6.25 MG/5ML solution Commonly known as: PHENERGAN Take 5 mLs by mouth daily.   pseudoephedrine-guaifenesin 60-600 MG 12 hr tablet Commonly known as: MUCINEX D Take 1 tablet by mouth every 12 (twelve) hours.        Allergies  Allergen Reactions   Soy Allergy Anaphylaxis    Consultations: none   Procedures/Studies: X-ray chest PA and lateral  Result Date: 05/28/2023 CLINICAL DATA:  Cough EXAM: CHEST - 2 VIEW COMPARISON:  10/31/2022 FINDINGS: The heart size and mediastinal contours are within normal limits. Both lungs are clear. Mild dextro-scoliotic midthoracic curvature. IMPRESSION: No active cardiopulmonary disease. Electronically Signed   By: Duanne Guess D.O.   On: 05/28/2023 19:51   MR BRAIN WO  CONTRAST  Result Date: 05/28/2023 CLINICAL DATA:  Ataxia. EXAM: MRI HEAD WITHOUT CONTRAST TECHNIQUE: Multiplanar, multiecho pulse sequences of the brain and surrounding structures were obtained without intravenous contrast. COMPARISON:  Same-day CT/CTA head and neck FINDINGS: Brain: There is no acute intracranial hemorrhage, extra-axial fluid collection, or acute infarct. Parenchymal volume is normal. The ventricles are normal in size. Parenchymal signal is normal. The pituitary and suprasellar region are normal. There is no mass lesion. There is no mass effect or midline shift. Vascular: Normal flow voids. Skull and upper cervical spine: Normal marrow signal. Sinuses/Orbits: The paranasal sinuses are clear. The globes and orbits are unremarkable. Other: The mastoid air cells and middle ear cavities are  clear. IMPRESSION: Normal brain MRI. Electronically Signed   By: Lesia Hausen M.D.   On: 05/28/2023 16:13   CT ANGIO HEAD NECK W WO CM (CODE STROKE)  Result Date: 05/28/2023 CLINICAL DATA:  Limb ataxia, dizziness EXAM: CT ANGIOGRAPHY HEAD AND NECK WITH AND WITHOUT CONTRAST TECHNIQUE: Multidetector CT imaging of the head and neck was performed using the standard protocol during bolus administration of intravenous contrast. Multiplanar CT image reconstructions and MIPs were obtained to evaluate the vascular anatomy. Carotid stenosis measurements (when applicable) are obtained utilizing NASCET criteria, using the distal internal carotid diameter as the denominator. RADIATION DOSE REDUCTION: This exam was performed according to the departmental dose-optimization program which includes automated exposure control, adjustment of the mA and/or kV according to patient size and/or use of iterative reconstruction technique. CONTRAST:  75mL OMNIPAQUE IOHEXOL 350 MG/ML SOLN COMPARISON:  Same-day noncontrast CT head FINDINGS: CTA NECK FINDINGS Aortic arch: Imaged aortic arch is normal. The origins of the major branch vessels are patent. The subclavian arteries are patent to the level imaged. Right carotid system: The right common, internal, and external carotid arteries are patent, without hemodynamically significant stenosis or occlusion. There is no evidence of dissection or aneurysm. Left carotid system: The left common, internal, and external carotid arteries are patent, without hemodynamically significant stenosis or occlusion there is no evidence of dissection or aneurysm. Vertebral arteries: The vertebral arteries are patent, without hemodynamically significant stenosis or occlusion there is no evidence of dissection or aneurysm. Skeleton: There is no acute osseous abnormality or suspicious osseous lesion. Other neck: Soft tissues of the neck are unremarkable. Upper chest: The imaged lung apices are clear. Review of  the MIP images confirms the above findings CTA HEAD FINDINGS Anterior circulation: The intracranial ICAs are normal. The bilateral MCAs and ACAS are normal. The anterior communicating artery is normal There is no aneurysm or AVM. Posterior circulation: The bilateral V4 segments are normal. The basilar artery is normal. The major cerebellar arteries are normal. The bilateral PCAs are normal. Diminutive posterior communicating arteries are identified. There is no aneurysm or AVM. Venous sinuses: Patent. Anatomic variants: None. Review of the MIP images confirms the above findings IMPRESSION: Normal CTA of the head and neck. Electronically Signed   By: Lesia Hausen M.D.   On: 05/28/2023 11:17   CT HEAD CODE STROKE WO CONTRAST  Result Date: 05/28/2023 CLINICAL DATA:  Code stroke.  Neuro deficit, acute, stroke suspected EXAM: CT HEAD WITHOUT CONTRAST TECHNIQUE: Contiguous axial images were obtained from the base of the skull through the vertex without intravenous contrast. RADIATION DOSE REDUCTION: This exam was performed according to the departmental dose-optimization program which includes automated exposure control, adjustment of the mA and/or kV according to patient size and/or use of iterative reconstruction technique. COMPARISON:  None Available. FINDINGS:  Brain: No evidence of acute infarction, hemorrhage, hydrocephalus, extra-axial collection or mass lesion/mass effect. Vascular: No hyperdense vessel or unexpected calcification. Skull: Normal. Negative for fracture or focal lesion. Sinuses/Orbits: No middle ear or mastoid effusion. Paranasal sinuses are clear orbits are unremarkable Other: None. ASPECTS [facility] Stroke Program Early CT Score): 10 IMPRESSION: No hemorrhage or CT evidence of an acute cortical infarct. Findings were paged to Dr. Roda Shutters on 05/28/23 at 10:46 AM via [facility] paging system. Electronically Signed   By: Lorenza Cambridge M.D.   On: 05/28/2023 10:46      Subjective:  No significant events  overnight, he denies any complaints, he is not orthostatic, ambulated couple times in the room without dizziness, no shortness of breath  Discharge Exam: Vitals:   05/29/23 1023 05/29/23 1025  BP: 124/81 136/87  Pulse: 84 84  Resp:    Temp:    SpO2:     Vitals:   05/29/23 0800 05/29/23 1022 05/29/23 1023 05/29/23 1025  BP: 127/76 124/81 124/81 136/87  Pulse: 86 83 84 84  Resp: 16     Temp: (!) 97.4 F (36.3 C)     TempSrc: Oral     SpO2: 96%     Weight:      Height:        General: Pt is alert, awake, not in acute distress Cardiovascular: RRR, S1/S2 +, no rubs, no gallops Respiratory: CTA bilaterally, no wheezing, no rhonchi Abdominal: Soft, NT, ND, bowel sounds + Extremities: no edema, no cyanosis    The results of significant diagnostics from this hospitalization (including imaging, microbiology, ancillary and laboratory) are listed below for reference.     Microbiology: No results found for this or any previous visit (from the past 240 hour(s)).   Labs: BNP (last 3 results) No results for input(s): "BNP" in the last 8760 hours. Basic Metabolic Panel: Recent Labs  Lab 05/28/23 1034 05/28/23 1052 05/29/23 0552  NA 136 140 135  K 3.4* 3.5 4.5  CL 101 102 103  CO2 25  --  26  GLUCOSE 116* 114* 140*  BUN 11 13 15   CREATININE 1.09 1.10 0.97  CALCIUM 9.2  --  9.4   Liver Function Tests: Recent Labs  Lab 05/28/23 1034  AST 24  ALT 39  ALKPHOS 65  BILITOT 0.7  PROT 7.8  ALBUMIN 4.4   No results for input(s): "LIPASE", "AMYLASE" in the last 168 hours. Recent Labs  Lab 05/28/23 2131  AMMONIA 39*   CBC: Recent Labs  Lab 05/28/23 1034 05/28/23 1052 05/29/23 0552  WBC 11.6*  --  10.1  NEUTROABS 6.9  --  8.8*  HGB 15.4 15.6 14.3  HCT 45.0 46.0 40.8  MCV 80.9  --  79.7*  PLT 258  --  233   Cardiac Enzymes: No results for input(s): "CKTOTAL", "CKMB", "CKMBINDEX", "TROPONINI" in the last 168 hours. BNP: Invalid input(s): "POCBNP" CBG: No  results for input(s): "GLUCAP" in the last 168 hours. D-Dimer Recent Labs    05/29/23 0553  DDIMER <0.27   Hgb A1c No results for input(s): "HGBA1C" in the last 72 hours. Lipid Profile No results for input(s): "CHOL", "HDL", "LDLCALC", "TRIG", "CHOLHDL", "LDLDIRECT" in the last 72 hours. Thyroid function studies No results for input(s): "TSH", "T4TOTAL", "T3FREE", "THYROIDAB" in the last 72 hours.  Invalid input(s): "FREET3" Anemia work up Recent Labs    05/28/23 2131  VITAMINB12 357   Urinalysis No results found for: "COLORURINE", "APPEARANCEUR", "LABSPEC", "PHURINE", "GLUCOSEU", "HGBUR", "BILIRUBINUR", "KETONESUR", "PROTEINUR", "  UROBILINOGEN", "NITRITE", "LEUKOCYTESUR" Sepsis Labs Recent Labs  Lab 05/28/23 1034 05/29/23 0552  WBC 11.6* 10.1   Microbiology No results found for this or any previous visit (from the past 240 hour(s)).   Time coordinating discharge: Over 30 minutes  SIGNED:   Huey Bienenstock, MD  [facility] 05/29/2023, 10:56 AM Pager   If 7PM-7AM, please contact night-coverage [email or web address]

## 2023-05-29 NOTE — Progress Notes (Signed)
OT Cancellation Note  Patient Details Name: Robert Mahoney MRN: [id number] DOB: 08/27/1996   Cancelled Treatment:    Reason Eval/Treat Not Completed: OT screened, no needs identified, will sign off Per nursing, pt back to baseline [place]. No formal OT eval needed  Lorre Munroe 05/29/2023, 11:41 AM
